# Patient Record
Sex: Male | Born: 1995 | Race: White | Hispanic: No | Marital: Single | Smoking: Never smoker
Health system: Southern US, Community
[De-identification: ages and names within clinical notes are randomized; demographics above are authoritative.]

---
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
## 2017-01-15 ENCOUNTER — Ambulatory Visit (INDEPENDENT_AMBULATORY_CARE_PROVIDER_SITE_OTHER): Payer: PRIVATE HEALTH INSURANCE | Admitting: Otolaryngology

## 2017-01-15 DIAGNOSIS — R040 Epistaxis: Secondary | ICD-10-CM | POA: Diagnosis not present

## 2018-09-05 ENCOUNTER — Ambulatory Visit (INDEPENDENT_AMBULATORY_CARE_PROVIDER_SITE_OTHER): Payer: BC Managed Care – PPO | Admitting: Orthopaedic Surgery

## 2018-09-05 ENCOUNTER — Encounter: Payer: Self-pay | Admitting: Orthopaedic Surgery

## 2018-09-05 ENCOUNTER — Other Ambulatory Visit: Payer: Self-pay

## 2018-09-05 DIAGNOSIS — M67869 Other specified disorders of synovium and tendon, unspecified knee: Secondary | ICD-10-CM | POA: Diagnosis not present

## 2018-09-05 DIAGNOSIS — M7650 Patellar tendinitis, unspecified knee: Secondary | ICD-10-CM | POA: Insufficient documentation

## 2018-09-05 NOTE — Progress Notes (Signed)
Office Visit Note   Patient: Victor SimplerMatthew Saye           Date of Birth: 02/17/1996           MRN: [id number] Visit Date: 09/05/2018              Requested by: Richardean Chimeraaniel, Terry G, MD [address] [place],  [place] [id number] PCP: Richardean Chimeraaniel, Terry G, MD   Assessment & Plan: Visit Diagnoses:  1. Patellar tendinosis           Right knee   Plan: We will set him up for some physical therapy possibly iontophoresis for treatment of his patellar tendinopathy.  We discussed trying to modify his MMA training to decrease patellar tendon stresses.  He can discuss with his therapist.  Recheck 5 weeks if he is having persistent problems will consider MRI imaging.  Pathophysiology discussed we looked knee pictures and patellar tendon anatomy pictures.  Follow-Up Instructions: Return in about 5 weeks (around 10/10/2018).   Orders:  No orders of the defined types were placed in this encounter.  No orders of the defined types were placed in this encounter.     Procedures: No procedures performed   Clinical Data: No additional findings.   Subjective: Chief Complaint  Patient presents with  . Right Knee - Pain    HPI 23 year old male with right knee pain onset January 2020 when he went to a conference in [place].  He likes to participate in MMA which is done for several years and notes pain with jumping.  The soreness is anteriorly over the knee.  Used a knee brace he has some pain with stairs.  Is treated with prednisone 60 mg/day for 3 days and states it made it feel tremendously better but his symptoms have recurred when it stopped.  No knee effusion noted no past history of injury.  He did play some football in high school.  No rheumatologic conditions.  Previous Dupuytren's excision in his left palm.  Recent radiographs of his knee were normal.  No locking chills or fever.  Review of Systems 14 point review of system positive for acid reflux depression and migraines.  He was no current medications.   Previous prednisone as listed above.   Objective: Vital Signs: Ht 5\' 11"  (1.803 m)   Wt 200 lb (90.7 kg)   BMI 27.89 kg/m   Physical Exam Constitutional:      Appearance: He is well-developed.  HENT:     Head: Normocephalic and atraumatic.  Eyes:     Pupils: Pupils are equal, round, and reactive to light.  Neck:     Thyroid: No thyromegaly.     Trachea: No tracheal deviation.  Cardiovascular:     Rate and Rhythm: Normal rate.  Pulmonary:     Effort: Pulmonary effort is normal.     Breath sounds: No wheezing.  Abdominal:     General: Bowel sounds are normal.     Palpations: Abdomen is soft.  Skin:    General: Skin is warm and dry.     Capillary Refill: Capillary refill takes less than 2 seconds.  Neurological:     Mental Status: He is alert and oriented to person, place, and time.  Psychiatric:        Behavior: Behavior normal.        Thought Content: Thought content normal.        Judgment: Judgment normal.     Ortho Exam pendent patient has tenderness over the  patellar tendon more proximal than mid and distal primarily in the midportion.  Negative patella grind test collateral crucial ligament exam is normal no knee effusion hip range of motion is normal negative straight leg raising negative popliteal compression test no rash over exposed skin distal pulses are 2+.  Ankle range of motion is normal.  He is able ambulate.  He is slow with single-leg hopping and has pain over the patellar tendon on his right knee no problems with the left.  Specialty Comments:  No specialty comments available.  Imaging: No results found.   PMFS History: Patient Active Problem List   Diagnosis Date Noted  . 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 Localized superficial swelling, mass, or lump   . ANKLE PAIN 07/24/2007   No past medical history on file.  No family history on file.  Past Surgical History:  Procedure Laterality Date  . CYST EXCISION Left 07/29/2015   Procedure: EXCISION  OF MASS, LEFT PALM;  Surgeon: Carole Civil, MD;  Location: [facility];  Service: Orthopedics;  Laterality: Left;   Social History   Occupational History  . Not on file  Tobacco Use  . Smoking status: Never Smoker  . Smokeless tobacco: Never Used  Substance and Sexual Activity  . Alcohol use: No  . Drug use: Yes    Types: Marijuana  . Sexual activity: Not on file

## 2018-09-19 ENCOUNTER — Ambulatory Visit (HOSPITAL_COMMUNITY): Payer: BC Managed Care – PPO | Attending: Orthopaedic Surgery

## 2018-09-19 ENCOUNTER — Encounter (HOSPITAL_COMMUNITY): Payer: Self-pay

## 2018-09-19 ENCOUNTER — Other Ambulatory Visit: Payer: Self-pay

## 2018-09-19 DIAGNOSIS — M25561 Pain in right knee: Secondary | ICD-10-CM | POA: Diagnosis not present

## 2018-09-19 DIAGNOSIS — G8929 Other chronic pain: Secondary | ICD-10-CM | POA: Diagnosis present

## 2018-09-19 DIAGNOSIS — R29898 Other symptoms and signs involving the musculoskeletal system: Secondary | ICD-10-CM | POA: Insufficient documentation

## 2018-09-19 NOTE — Therapy (Addendum)
[facility] [address] [place], [place], [address] Phone: [phone]   Fax:  [phone]  Physical Therapy Evaluation  Patient Details  Name: Victor SimplerMatthew Ashley MRN: [id number] Date of Birth: 11/20/1995 Referring Provider (PT): Eldred MangesMark C Yates, MD   Encounter Date: 09/19/2018  PT End of Session - 09/19/18 1413    Visit Number  1    Number of Visits  4    Date for PT Re-Evaluation  10/17/18    Authorization Type  BCBS    Authorization Time Period  09/19/18 to 10/17/18    PT Start Time  1318    PT Stop Time  1358    PT Time Calculation (min)  40 min    Activity Tolerance  Patient tolerated treatment well    Behavior During Therapy  [facility] for tasks assessed/performed       History reviewed. No pertinent past medical history.  Past Surgical History:  Procedure Laterality Date  . CYST EXCISION Left 07/29/2015   Procedure: EXCISION OF MASS, LEFT PALM;  Surgeon: Vickki HearingStanley E Harrison, MD;  Location: [facility];  Service: Orthopedics;  Laterality: Left;    There were no vitals filed for this visit.   Subjective Assessment - 09/19/18 1321    Subjective  Pt reports January 2020 he began experiencing pain when bending R knee, straightening and standing on it with no injury known. Pt reports inability to fully extend L knee and with pain. Pt reports pain right around knee cap, but not tender to palpation. Pt reports doctor states he thinks pt has LCL tears, but should attempt PT, and if no progress or pain relief then order MRI for further testing. Pt reports sharp pain as soon as attempting to straighten, always nagging discomfort, difficulty with pivoting, stairs, kick boxing and sparing. Pt reports wearing neoprene brace when he remembers which relieves the pain. Pt reports sparring and kick-boxing weekly in barefeet and running long distances with pain, but pushes through.    Limitations  Walking    How long can you sit comfortably?  no issues    How long can  you stand comfortably?  no issues    How long can you walk comfortably?  hurts after a few steps, but then eases; hurts with long distance running (3-4 miles)    Diagnostic tests  x-ray    Patient Stated Goals  get rid of the pain    Currently in Pain?  No/denies    Aggravating Factors   fully extension, bending, weight on it, pivoting in standing    Pain Relieving Factors  rest, NSAIDs    Effect of Pain on Daily Activities  minimal effect, pushed through         [facility] - 09/19/18 0001      Assessment   Medical Diagnosis  Right Patellar Tendinopathy    Referring Provider (PT)  Eldred MangesMark C Yates, MD    Onset Date/Surgical Date  03/20/18   begining of 2020   Next MD Visit  Later this month    Prior Therapy  not for this      Precautions   Precautions  None      Restrictions   Weight Bearing Restrictions  No      Balance Screen   Has the patient fallen in the past 6 months  No    Has the patient had a decrease in activity level because of a fear of falling?   No  Is the patient reluctant to leave their home because of a fear of falling?   No      Prior Function   Level of Independence  Independent    Vocation  Part time employment    Vocation Requirements  driving, digging holes, working on Cytogeneticist training, working out      Observation/Other Assessments   Focus on Therapeutic Outcomes (FOTO)   to be completed next visit      Sensation   Light Touch  Appears Intact      Functional Tests   Functional tests  Single Leg Squat;Other;Step down;Other2   R knee pain with all functional tests     Step Down   Comments  R: knee valgus, tibial external rotation, foot eversion, and mild unsteadiness; L: early heel rise, knee valgus      Single Leg Squat   Comments  R: knee valgus (R knee touches LLE), tibial external rotation, foot eversion, and mild unsteadiness; L: early heel rise, knee valgus (to midline, doesn't touch RLE)      Other:   Other/  Comments  Single leg hop: R: 62.5" L: 71"; single leg 3 hop: R: 15'10" L: 14'; cross over: R: 14' 2.5", L: 14' 9.75"   R knee pain with landing>jumping     Other:   Other/Comments  1RM R: 20#, L: 80#   R knee pain, unable to fully extend     ROM / Strength   AROM / PROM / Strength  AROM;Strength      AROM   AROM Assessment Site  Knee;Ankle    Right/Left Knee  Right;Left    Right Knee Extension  2    Right Knee Flexion  130    Left Knee Extension  -4   hyperextension   Left Knee Flexion  130    Right Ankle Dorsiflexion  0    Left Ankle Dorsiflexion  0      Strength   Strength Assessment Site  Hip;Knee;Ankle    Right Hip Flexion  5/5    Right Hip Extension  5/5    Right Hip ABduction  5/5    Left Hip Flexion  5/5    Left Hip Extension  5/5    Left Hip ABduction  5/5    Right Knee Flexion  5/5    Right Knee Extension  4+/5   pain   Left Knee Flexion  5/5    Left Knee Extension  5/5    Right Ankle Dorsiflexion  5/5    Right Ankle Plantar Flexion  5/5    Left Ankle Dorsiflexion  5/5    Left Ankle Plantar Flexion  5/5      Flexibility   Soft Tissue Assessment /Muscle Length  yes    Hamstrings  90/90: WFL bil    Quadriceps  WFL bil   no pain     Palpation   Patella mobility  good mobility bil    Palpation comment  denies tenderness over patellar tendon, MCL, LCL, patella      Ambulation/Gait   Ambulation/Gait  Yes    Gait Pattern  Step-through pattern;Decreased dorsiflexion - right;Decreased dorsiflexion - left   increased R toe-out, early heel rise bil   Gait Comments  Barefoot, level ground, no pain         Objective measurements completed on examination: See above findings.      PT Education - 09/19/18 1413    Education Details  Assessment findings, POC, initaited HEP and exercise technique    Person(s) Educated  Patient    Methods  Explanation;Demonstration;Handout    Comprehension  Verbalized understanding       PT Short Term Goals - 09/19/18 1419       PT SHORT TERM GOAL #1   Title  Pt will increase R quad 1RM to 40# to demo improved strength and reduced pain.    Baseline  7/2: R:20#, L:80#    Time  3    Period  Weeks    Status  New    Target Date  10/10/18      PT SHORT TERM GOAL #2   Title  Pt will report 4/10 pain during exercise regimen to demo improved mechanics and improve QoL.    Time  3    Period  Weeks    Status  New      PT SHORT TERM GOAL #3   Title  Pt will be independent with HEP to maximize gait pattern, improve strength and return to PLOF.    Time  3    Period  Weeks    Status  New        PT Long Term Goals - 09/19/18 1450      PT LONG TERM GOAL #1   Title  Pt will demo equal single leg hop tests and report no pain in R knee with performance.    Time  4    Period  Weeks    Status  New    Target Date  10/17/18      PT LONG TERM GOAL #2   Title  Pt will increase R quad 1RM to 60# to demo improved strength and reduced pain.    Time  4    Period  Weeks    Status  New      PT LONG TERM GOAL #3   Title  Pt will have 10 degrees of active dorsiflexion bil to allow for proper body mechanics with stair negotiation and gait.    Time  4    Period  Weeks    Status  New      PT LONG TERM GOAL #4   Title  Pt will perform single leg squat bil with proper body mechanics, avoiding knee valgus, eversion or early heel rise compensations and deny pain to demo improved functional strength and mechanics.    Time  4    Period  Weeks    Status  New         Plan - 09/19/18 1416    Clinical Impression Statement  Pt is a pleasant 22YO male with complaints of R patellar tendon pain that has been going on for 7 months with no known mechanism of injury. 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 Pt specifically has increased pain with pivoting and weighted bent-knee activities like stairs and is  unable to fully extend R knee. Pt with good strength throughout BLE, but is limited AROM for R knee extension and bil dorsiflexion. Per single limb hop test, R and L LE distances are within 90% of one another, but pain in present with all R LE hopping. Pt with compensations on single leg squat and step down bil due to lack of active dorsiflexion. As noted above, pt with R knee valgus, tibial external rotation and foot eversion all noted in both  single limb squat and step down tests. Pt with early heel rise when performing L single limb squat and step down test. Pt demonstrates weak R quad with 1RM test performing at 80# on L and 20# on R with pain and inability to fully extend R knee throughout LAQ range. Pt is showing signs and symptoms consistent with possible meniscal pathology and demonstrates impaired ankle mobility with functional activities. Pt would benefit from skilled PT to improve positional deficits, strength throughout BLE without compensations, AROM throughout BLE and relieve pain to improve ability to perform functional tasks.    Personal Factors and Comorbidities  Age;Comorbidity 1;Fitness;Time since onset of injury/illness/exacerbation    Comorbidities  see above    Examination-Activity Limitations  Squat;Stairs    Examination-Participation Restrictions  Community Activity    Stability/Clinical Decision Making  Stable/Uncomplicated    Clinical Decision Making  Low    Rehab Potential  Good    PT Frequency  1x / week    PT Duration  4 weeks    PT Treatment/Interventions  ADLs/Self Care Home Management;Aquatic Therapy;Biofeedback;Cryotherapy;Electrical Stimulation;Iontophoresis 4mg /ml Dexamethasone;Moist Heat;Ultrasound;Gait training;Stair training;Functional mobility training;Therapeutic activities;Therapeutic exercise;Balance training;Neuromuscular re-education;Patient/family education;Orthotic Fit/Training;Manual techniques;Passive range of motion;Dry needling;Taping;Joint Manipulations     PT Next Visit Plan  Initiate ankle ROM exercises, calf/gastroc stretch, intrinsic foot strengthening, standing balance and strengthening exercises with good hip/knee/ankle alignment, STM and MWM as needed to relieve pain and improve ankle ROM    PT Home Exercise Plan  Eval: knee drives in half kneel, calf stretch    Consulted and Agree with Plan of Care  Patient       Patient will benefit from skilled therapeutic intervention in order to improve the following deficits and impairments:  Decreased activity tolerance, Decreased range of motion, Decreased strength, Improper body mechanics, Pain  Visit Diagnosis: 1. Chronic pain of right knee   2. Other symptoms and signs involving the musculoskeletal system        Problem List Patient Active Problem List   Diagnosis Date Noted  . 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 Localized superficial swelling, mass, or lump   . ANKLE PAIN 07/24/2007     Domenick Bookbinderori Zed Wanninger PT, DPT  [facility] [address] [place], [place], [address] Phone: [phone]   Fax:  [phone]  Name: Victor SimplerMatthew Hannibal MRN: [id number] Date of Birth: 03/12/1996

## 2018-09-27 ENCOUNTER — Ambulatory Visit (HOSPITAL_COMMUNITY): Payer: BC Managed Care – PPO

## 2018-09-27 ENCOUNTER — Other Ambulatory Visit: Payer: Self-pay

## 2018-09-27 ENCOUNTER — Encounter (HOSPITAL_COMMUNITY): Payer: Self-pay

## 2018-09-27 ENCOUNTER — Encounter

## 2018-09-27 DIAGNOSIS — G8929 Other chronic pain: Secondary | ICD-10-CM

## 2018-09-27 DIAGNOSIS — M25561 Pain in right knee: Secondary | ICD-10-CM | POA: Diagnosis not present

## 2018-09-27 DIAGNOSIS — R29898 Other symptoms and signs involving the musculoskeletal system: Secondary | ICD-10-CM

## 2018-09-27 NOTE — Patient Instructions (Signed)
Gastroc, Standing ° ° ° °Stand, right foot behind, heel on floor and turned slightly out, leg straight, forward leg bent. Keeping arms straight, push pelvis forward until stretch is felt in calf. Hold 30 seconds. °Repeat 3 times per session. Do 2 sessions per day. ° °Copyright © [employer]. All rights reserved.  ° °

## 2018-09-27 NOTE — Therapy (Signed)
[facility] [address] [place], [place], [address] Phone: [phone]   Fax:  [phone]  Physical Therapy Treatment  Patient Details  Name: Victor SimplerMatthew Ashley MRN: [id number] Date of Birth: 08/16/1995 Referring Provider (PT): Eldred MangesMark C Yates, MD   Encounter Date: 09/27/2018  PT End of Session - 09/27/18 1412    Visit Number  2    Number of Visits  4    Date for PT Re-Evaluation  10/17/18    Authorization Type  BCBS    Authorization Time Period  09/19/18 to 10/17/18    PT Start Time  1315    PT Stop Time  1402    PT Time Calculation (min)  47 min    Activity Tolerance  Patient tolerated treatment well    Behavior During Therapy  [facility] for tasks assessed/performed       History reviewed. No pertinent past medical history.  Past Surgical History:  Procedure Laterality Date  . CYST EXCISION Left 07/29/2015   Procedure: EXCISION OF MASS, LEFT PALM;  Surgeon: Vickki HearingStanley E Harrison, MD;  Location: [facility];  Service: Orthopedics;  Laterality: Left;    There were no vitals filed for this visit.  Subjective Assessment - 09/27/18 1315    Subjective  Pt reports he went hiking last weekend and had increased pain following.  Current pain scale 2/10 Rt knee, sharp pain with certain movements.    Currently in Pain?  Yes    Pain Score  2     Pain Location  Knee    Pain Orientation  Right    Pain Descriptors / Indicators  Sharp    Pain Type  Acute pain    Pain Onset  More than a month ago    Pain Frequency  Intermittent    Aggravating Factors   fully extension, bending, weight on it, pivoting in standing    Pain Relieving Factors  rest, NSAIDs    Effect of Pain on Daily Activities  minimal effect, pushed through         [facility] - 09/27/18 0001      Assessment   Medical Diagnosis  Right Patellar Tendinopathy    Referring Provider (PT)  Eldred MangesMark C Yates, MD    Onset Date/Surgical Date  03/20/18   beginning of 2020   Next MD Visit  Later  this month    Prior Therapy  not for this      Observation/Other Assessments   Focus on Therapeutic Outcomes (FOTO)   44% limited                   [facility] Adult PT Treatment/Exercise - 09/27/18 0001      Exercises   Exercises  Knee/Hip      Knee/Hip Exercises: Stretches   Knee: Self-Stretch to increase Flexion  5 reps;10 seconds    Knee: Self-Stretch Limitations  Knee drive on 69GE12in step with therapist MWM for DF    Gastroc Stretch  30 seconds;4 reps    Gastroc Stretch Limitations  2 against wall, 2 set slant board    Other Knee/Hip Stretches  Half kneeling knee drive for DF 3x 30"      Knee/Hip Exercises: Standing   Other Standing Knee Exercises  Arch formation with great toe extension 10x 10" holds (ceuing for mechanics)      Knee/Hip Exercises: Seated   Other Seated Knee/Hip Exercises  towel crunch 5#  x 3    Other Seated Knee/Hip  Exercises  Inv/Ev 5# 3x each      Manual Therapy   Manual Therapy  Soft tissue mobilization    Manual therapy comments  Manual complete separate than rest of tx    Soft tissue mobilization  STM gastroc/soleus complex in prone position             PT Education - 09/27/18 1328    Education Details  Reviewed goals, assured compliance with HEP with min cueing to improve form, FOTO complete    Person(s) Educated  Patient    Methods  Explanation    Comprehension  Verbalized understanding       PT Short Term Goals - 09/19/18 1419      PT SHORT TERM GOAL #1   Title  Pt will increase R quad 1RM to 40# to demo improved strength and reduced pain.    Baseline  7/2: R:20#, L:80#    Time  3    Period  Weeks    Status  New    Target Date  10/10/18      PT SHORT TERM GOAL #2   Title  Pt will report 4/10 pain during exercise regimen to demo improved mechanics and improve QoL.    Time  3    Period  Weeks    Status  New      PT SHORT TERM GOAL #3   Title  Pt will be independent with HEP to maximize gait pattern, improve strength and  return to PLOF.    Time  3    Period  Weeks    Status  New        PT Long Term Goals - 09/19/18 1450      PT LONG TERM GOAL #1   Title  Pt will demo equal single leg hop tests and report no pain in R knee with performance.    Time  4    Period  Weeks    Status  New    Target Date  10/17/18      PT LONG TERM GOAL #2   Title  Pt will increase R quad 1RM to 60# to demo improved strength and reduced pain.    Time  4    Period  Weeks    Status  New      PT LONG TERM GOAL #3   Title  Pt will have 10 degrees of active dorsiflexion bil to allow for proper body mechanics with stair negotiation and gait.    Time  4    Period  Weeks    Status  New      PT LONG TERM GOAL #4   Title  Pt will perform single leg squat bil with proper body mechanics, avoiding knee valgus, eversion or early heel rise compensations and deny pain to demo improved functional strength and mechanics.    Time  4    Period  Weeks    Status  New            Plan - 09/27/18 1415    Clinical Impression Statement  Reviewed goals, FOTO complete and assured compliance with HEP.  Pt able to recall 1 HEP exercises- calf stretch but not knee drives for DF.  Reviewed exercises with cueing to assure correct form and technique for maximal benefits.  Session focus on ankle mobility, stretches, intrinsic strengthening and manual to improve dorsiflexion and reduce overall tightness in gastroc/soleus complex.  Pt able to complete all exercises with no reports of  increased pain.  Most difficulty with arch formation exercises, tendency to compensate and required verbal and tactile cueing for proper form.  Manual MWM complete with knee drive on step to improve dorsiflexion and ended session wiht soft tissue mobilization with reports of feeling looser at EOS.    Personal Factors and Comorbidities  Age;Comorbidity 1;Fitness;Time since onset of injury/illness/exacerbation    Comorbidities  see above    Examination-Activity  Limitations  Squat;Stairs    Examination-Participation Restrictions  Community Activity    Stability/Clinical Decision Making  Stable/Uncomplicated    Clinical Decision Making  Low    Rehab Potential  Good    PT Frequency  1x / week    PT Duration  4 weeks    PT Treatment/Interventions  ADLs/Self Care Home Management;Aquatic Therapy;Biofeedback;Cryotherapy;Electrical Stimulation;Iontophoresis 4mg /ml Dexamethasone;Moist Heat;Ultrasound;Gait training;Stair training;Functional mobility training;Therapeutic activities;Therapeutic exercise;Balance training;Neuromuscular re-education;Patient/family education;Orthotic Fit/Training;Manual techniques;Passive range of motion;Dry needling;Taping;Joint Manipulations    PT Next Visit Plan  Add arch formation to HEP if able to complete correclty without cueing.  Continue with ankle ROM exercises, calf/gastroc stretch, intrinsic foot strengthening, standing balance and strengthening exercises with good hip/knee/ankle alignment, STM and MWM as needed to relieve pain and improve ankle ROM    PT Home Exercise Plan  Eval: knee drives in half kneel, calf stretch       Patient will benefit from skilled therapeutic intervention in order to improve the following deficits and impairments:  Decreased activity tolerance, Decreased range of motion, Decreased strength, Improper body mechanics, Pain  Visit Diagnosis: 1. Chronic pain of right knee   2. Other symptoms and signs involving the musculoskeletal system        Problem List Patient Active Problem List   Diagnosis Date Noted  . 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 Localized superficial swelling, mass, or lump   . ANKLE PAIN 07/24/2007   Becky Saxasey Jahmari Esbenshade, LPTA; CBIS [phone]  Juel BurrowCockerham, Alenah Sarria Jo 09/27/2018, 3:58 PM   [facility] [address] [place], [place], [address] Phone: [phone]   Fax:  [phone]  Name: Victor SimplerMatthew Limb MRN: [id number] Date of  Birth: 11/17/1995

## 2018-10-07 ENCOUNTER — Telehealth (HOSPITAL_COMMUNITY): Payer: Self-pay | Admitting: Physical Therapy

## 2018-10-07 NOTE — Telephone Encounter (Signed)
He can not get here today  -requested a Friday

## 2018-10-08 ENCOUNTER — Ambulatory Visit (HOSPITAL_COMMUNITY): Payer: BC Managed Care – PPO | Admitting: Physical Therapy

## 2018-10-10 ENCOUNTER — Other Ambulatory Visit: Payer: Self-pay

## 2018-10-10 ENCOUNTER — Encounter: Payer: Self-pay | Admitting: Orthopaedic Surgery

## 2018-10-10 ENCOUNTER — Ambulatory Visit (INDEPENDENT_AMBULATORY_CARE_PROVIDER_SITE_OTHER): Payer: BC Managed Care – PPO | Admitting: Orthopaedic Surgery

## 2018-10-10 VITALS — BP 129/83 | HR 84 | Ht 71.0 in | Wt 211.0 lb

## 2018-10-10 DIAGNOSIS — M67869 Other specified disorders of synovium and tendon, unspecified knee: Secondary | ICD-10-CM

## 2018-10-10 DIAGNOSIS — M7650 Patellar tendinitis, unspecified knee: Secondary | ICD-10-CM

## 2018-10-11 ENCOUNTER — Encounter (HOSPITAL_COMMUNITY): Payer: Self-pay | Admitting: Physical Therapy

## 2018-10-11 ENCOUNTER — Ambulatory Visit (HOSPITAL_COMMUNITY): Payer: BC Managed Care – PPO | Admitting: Physical Therapy

## 2018-10-11 DIAGNOSIS — G8929 Other chronic pain: Secondary | ICD-10-CM

## 2018-10-11 DIAGNOSIS — M25561 Pain in right knee: Secondary | ICD-10-CM | POA: Diagnosis not present

## 2018-10-11 DIAGNOSIS — R29898 Other symptoms and signs involving the musculoskeletal system: Secondary | ICD-10-CM

## 2018-10-11 NOTE — Progress Notes (Signed)
Office Visit Note   Patient: Victor Ashley           Date of Birth: 08/08/1995           MRN: [id number] Visit Date: 10/10/2018              Requested by: Richardean Chimeraaniel, Terry G, MD [address] [place],  [place] [address] PCP: Richardean Chimeraaniel, Terry G, MD   Assessment & Plan: Visit Diagnoses:  1. Patellar tendinosis     Plan: We will proceed with MRI scan of his right knee rule out patellar tendinitis versus tear the anterior horn the medial meniscus.  Office follow-up after scan for review.  Follow-Up Instructions: No follow-ups on file.   Orders:  Orders Placed This Encounter  Procedures  . MR Knee Right w/o contrast   No orders of the defined types were placed in this encounter.     Procedures: No procedures performed   Clinical Data: No additional findings.   Subjective: Chief Complaint  Patient presents with  . Right Knee - Pain, Follow-up    HPI 23 year old male returns with ongoing problems with right knee pain anteriorly.  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  He has been through physical therapy x1 month and states some of the activities such as jumping and resistive exercises with therapy made him have significant increase in pain.  He continues to have pain with walking denies problems with knee effusion no other joint symptoms and has pain anteriorly over the patellar tendon is slightly over the medial joint line.  Patient is used anti-inflammatories and intermittent ice.  Symptoms have persisted.  He is not able to resume mixed martial arts activities and is requesting proceeding with an MRI scan.  Last month plain radiographs were obtained and were normal.  Review of Systems 14 point review of systems is updated unchanged from 09/05/2018 office visit.   Objective: Vital Signs: BP 129/83   Pulse 84   Ht 5\' 11"  (1.803 m)   Wt 211 lb (95.7 kg)   BMI 29.43 kg/m   Physical Exam Constitutional:      Appearance: He is well-developed.  HENT:   Head: Normocephalic and atraumatic.  Eyes:     Pupils: Pupils are equal, round, and reactive to light.  Neck:     Thyroid: No thyromegaly.     Trachea: No tracheal deviation.  Cardiovascular:     Rate and Rhythm: Normal rate.  Pulmonary:     Effort: Pulmonary effort is normal.     Breath sounds: No wheezing.  Abdominal:     General: Bowel sounds are normal.     Palpations: Abdomen is soft.  Skin:    General: Skin is warm and dry.     Capillary Refill: Capillary refill takes less than 2 seconds.  Neurological:     Mental Status: He is alert and oriented to person, place, and time.  Psychiatric:        Behavior: Behavior normal.        Thought Content: Thought content normal.        Judgment: Judgment normal.     Ortho Exam patient has tenderness along the midportion the more proximal portion of the patellar tendon adjacent to the patella.  No knee effusion is noted normal patellar tracking he has some tenderness over the anterior horn of the medial meniscus but more tenderness over the patellar tendon.  Distal aspect of the tendon is normal.  Collateral crucial  ligament exam is normal.  He has some pain with hyperextension at the anterior horn of the medial meniscus.  Specialty Comments:  No specialty comments available.  Imaging: No results found.   PMFS History: Patient Active Problem List   Diagnosis Date Noted  . 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 Localized superficial swelling, mass, or lump   . ANKLE PAIN 07/24/2007   History reviewed. No pertinent past medical history.  History reviewed. No pertinent family history.  Past Surgical History:  Procedure Laterality Date  . CYST EXCISION Left 07/29/2015   Procedure: EXCISION OF MASS, LEFT PALM;  Surgeon: Carole Civil, MD;  Location: [facility];  Service: Orthopedics;  Laterality: Left;   Social History   Occupational History  . Not on file  Tobacco Use  . Smoking status: Never Smoker  . Smokeless tobacco: Never  Used  Substance and Sexual Activity  . Alcohol use: No  . Drug use: Yes    Types: Marijuana  . Sexual activity: Not on file

## 2018-10-11 NOTE — Therapy (Signed)
[facility] [address] [place], [place], [address] Phone: [phone]   Fax:  [phone]  Physical Therapy Treatment  Patient Details  Name: Victor SimplerMatthew Ashley MRN: [id number] Date of Birth: 09/11/1995 Referring Provider (PT): Eldred MangesMark C Yates, MD   Encounter Date: 10/11/2018  PT End of Session - 10/11/18 0937    Visit Number  3    Number of Visits  4    Date for PT Re-Evaluation  10/17/18    Authorization Type  BCBS    Authorization Time Period  09/19/18 to 10/17/18    PT Start Time  0931    PT Stop Time  1010    PT Time Calculation (min)  39 min    Activity Tolerance  Patient tolerated treatment well    Behavior During Therapy  [facility] for tasks assessed/performed       History reviewed. No pertinent past medical history.  Past Surgical History:  Procedure Laterality Date  . CYST EXCISION Left 07/29/2015   Procedure: EXCISION OF MASS, LEFT PALM;  Surgeon: Vickki HearingStanley E Harrison, MD;  Location: [facility];  Service: Orthopedics;  Laterality: Left;    There were no vitals filed for this visit.  Subjective Assessment - 10/11/18 0934    Subjective  Patient reported right knee pain of 3/10 today. Patient reported that he went back to the knee MD yesterday and that they are scheduling an MRI.    Currently in Pain?  Yes    Pain Score  3     Pain Location  Knee    Pain Orientation  Right    Pain Descriptors / Indicators  Sharp    Pain Type  Acute pain    Pain Onset  More than a month ago                       [facility]/Exercise - 10/11/18 0001      Exercises   Exercises  Knee/Hip      Knee/Hip Exercises: Stretches   Knee: Self-Stretch to increase Flexion  5 reps;10 seconds    Knee: Self-Stretch Limitations  Knee drive on 69GE12in step with therapist MWM for DF    Gastroc Stretch  30 seconds;4 reps    Gastroc Stretch Limitations  2 against wall for gastroc and 2 against wall for soleus, 2 set slant board    Other  Knee/Hip Stretches  Half kneeling knee drive for DF 3x 30"      Knee/Hip Exercises: Standing   Other Standing Knee Exercises  Arch formation with great toe extension 10x 10" holds (ceuing for mechanics)    Other Standing Knee Exercises  Standing on Dyna disc 2x 30'' each LE to work on intrinsic muscles and ankle muscles      Knee/Hip Exercises: Seated   Other Seated Knee/Hip Exercises  towel crunch 5#  x 3    Other Seated Knee/Hip Exercises  Inv/Ev 5# 3x each      Manual Therapy   Manual Therapy  Soft tissue mobilization    Manual therapy comments  Manual complete separate than rest of tx    Soft tissue mobilization  STM gastroc/soleus complex in prone position             PT Education - 10/11/18 0936    Education Details  Discussed purpose and technique of interventions throughout.    Person(s) Educated  Patient    Methods  Explanation    Comprehension  Verbalized understanding       PT Short Term Goals - 09/19/18 1419      PT SHORT TERM GOAL #1   Title  Pt will increase R quad 1RM to 40# to demo improved strength and reduced pain.    Baseline  7/2: R:20#, L:80#    Time  3    Period  Weeks    Status  New    Target Date  10/10/18      PT SHORT TERM GOAL #2   Title  Pt will report 4/10 pain during exercise regimen to demo improved mechanics and improve QoL.    Time  3    Period  Weeks    Status  New      PT SHORT TERM GOAL #3   Title  Pt will be independent with HEP to maximize gait pattern, improve strength and return to PLOF.    Time  3    Period  Weeks    Status  New        PT Long Term Goals - 09/19/18 1450      PT LONG TERM GOAL #1   Title  Pt will demo equal single leg hop tests and report no pain in R knee with performance.    Time  4    Period  Weeks    Status  New    Target Date  10/17/18      PT LONG TERM GOAL #2   Title  Pt will increase R quad 1RM to 60# to demo improved strength and reduced pain.    Time  4    Period  Weeks    Status   New      PT LONG TERM GOAL #3   Title  Pt will have 10 degrees of active dorsiflexion bil to allow for proper body mechanics with stair negotiation and gait.    Time  4    Period  Weeks    Status  New      PT LONG TERM GOAL #4   Title  Pt will perform single leg squat bil with proper body mechanics, avoiding knee valgus, eversion or early heel rise compensations and deny pain to demo improved functional strength and mechanics.    Time  4    Period  Weeks    Status  New            Plan - 10/11/18 1023    Clinical Impression Statement  Continued with established plan of care this session. Added soleus stretch in standing this session as well as single limb stance on Dyna disc to challenge foot and ankle muscles. Ended session with soft tissue mobilization to patient's right gastroc and soleus with patient reporting a decrease in pain at the end of the session.    Personal Factors and Comorbidities  Age;Comorbidity 1;Fitness;Time since onset of injury/illness/exacerbation    Comorbidities  see above    Examination-Activity Limitations  Squat;Stairs    Examination-Participation Restrictions  Community Activity    Stability/Clinical Decision Making  Stable/Uncomplicated    Rehab Potential  Good    PT Frequency  1x / week    PT Duration  4 weeks    PT Treatment/Interventions  ADLs/Self Care Home Management;Aquatic Therapy;Biofeedback;Cryotherapy;Electrical Stimulation;Iontophoresis 4mg /ml Dexamethasone;Moist Heat;Ultrasound;Gait training;Stair training;Functional mobility training;Therapeutic activities;Therapeutic exercise;Balance training;Neuromuscular re-education;Patient/family education;Orthotic Fit/Training;Manual techniques;Passive range of motion;Dry needling;Taping;Joint Manipulations    PT Next Visit Plan  Add arch formation to HEP if able to complete correclty without cueing.  Continue with ankle ROM exercises, calf/gastroc stretch, intrinsic foot strengthening, standing balance  and strengthening exercises with good hip/knee/ankle alignment, STM and MWM as needed to relieve pain and improve ankle ROM    PT Home Exercise Plan  Eval: knee drives in half kneel, calf stretch       Patient will benefit from skilled therapeutic intervention in order to improve the following deficits and impairments:  Decreased activity tolerance, Decreased range of motion, Decreased strength, Improper body mechanics, Pain  Visit Diagnosis: 1. Chronic pain of right knee   2. Other symptoms and signs involving the musculoskeletal system        Problem List Patient Active Problem List   Diagnosis Date Noted  . 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 Localized superficial swelling, mass, or lump   . ANKLE PAIN 07/24/2007   Verne CarrowMacy Enriqueta Augusta PT, DPT 10:24 AM, 10/11/18 [phone]  [facility] [address] [place], [place], [address] Phone: [phone]   Fax:  [phone]  Name: Victor SimplerMatthew Spilker MRN: [id number] Date of Birth: 05/30/1995

## 2018-10-15 ENCOUNTER — Telehealth (HOSPITAL_COMMUNITY): Payer: Self-pay | Admitting: Family Medicine

## 2018-10-15 ENCOUNTER — Ambulatory Visit (HOSPITAL_COMMUNITY): Payer: BC Managed Care – PPO | Admitting: Physical Therapy

## 2018-10-15 NOTE — Telephone Encounter (Signed)
10/15/18  I called patient because he had chosen to cx today's appt via phone tree.  He did mean to cancel and we rescheduled appt for 8/7

## 2018-10-21 ENCOUNTER — Ambulatory Visit (HOSPITAL_COMMUNITY)
Admission: RE | Admit: 2018-10-21 | Discharge: 2018-10-21 | Disposition: A | Discharge status: Home / Self Care | Payer: BC Managed Care – PPO | Source: Ambulatory Visit | Attending: Orthopaedic Surgery | Admitting: Orthopaedic Surgery

## 2018-10-21 ENCOUNTER — Other Ambulatory Visit: Payer: Self-pay

## 2018-10-21 DIAGNOSIS — M67869 Other specified disorders of synovium and tendon, unspecified knee: Secondary | ICD-10-CM | POA: Insufficient documentation

## 2018-10-21 DIAGNOSIS — M7650 Patellar tendinitis, unspecified knee: Secondary | ICD-10-CM

## 2018-10-25 ENCOUNTER — Ambulatory Visit (HOSPITAL_COMMUNITY): Payer: BC Managed Care – PPO | Attending: Orthopaedic Surgery

## 2018-10-25 ENCOUNTER — Other Ambulatory Visit: Payer: Self-pay

## 2018-10-25 ENCOUNTER — Encounter (HOSPITAL_COMMUNITY): Payer: Self-pay

## 2018-10-25 DIAGNOSIS — G8929 Other chronic pain: Secondary | ICD-10-CM | POA: Diagnosis present

## 2018-10-25 DIAGNOSIS — R29898 Other symptoms and signs involving the musculoskeletal system: Secondary | ICD-10-CM

## 2018-10-25 DIAGNOSIS — M25561 Pain in right knee: Secondary | ICD-10-CM | POA: Insufficient documentation

## 2018-10-25 NOTE — Therapy (Signed)
[place] [address] [place], [place], [address] Phone: [phone]   Fax:  [phone]   PHYSICAL THERAPY DISCHARGE SUMMARY  Visits from Start of Care: 4  Current functional level related to goals / functional outcomes: See below   Remaining deficits: See below   Education / Equipment: See below  Plan: Patient agrees to discharge.  Patient goals were partially met. Patient is being discharged due to lack of progress.  ?????        Physical Therapy Treatment  Patient Details  Name: Victor Ashley MRN: [id number] Date of Birth: 09-30-1995 Referring Provider (PT): Marybelle Killings, MD   Encounter Date: 10/25/2018  PT End of Session - 10/25/18 0825    Visit Number  4    Number of Visits  4    Date for PT Re-Evaluation  10/17/18    Authorization Type  BCBS    Authorization Time Period  09/19/18 to 10/17/18; NEW: 10/25/18 (cover d/c)    PT Start Time  0822    PT Stop Time  0850    PT Time Calculation (min)  28 min    Activity Tolerance  Patient tolerated treatment well    Behavior During Therapy  [facility] for tasks assessed/performed       History reviewed. No pertinent past medical history.  Past Surgical History:  Procedure Laterality Date  . CYST EXCISION Left 07/29/2015   Procedure: EXCISION OF MASS, LEFT PALM;  Surgeon: Carole Civil, MD;  Location: [facility];  Service: Orthopedics;  Laterality: Left;    There were no vitals filed for this visit.  Subjective Assessment - 10/25/18 0824    Subjective  Pt reports he feels like he can maintain at home and wishes to be discharged today. Pt reports he got MRI Monday but has not received results yet.    Limitations  Walking    How long can you sit comfortably?  no issues    How long can you stand comfortably?  no issues    How long can you walk comfortably?  hurts after a few steps, but then eases; hurts with long distance running (3-4 miles)    Diagnostic tests  x-ray    Patient  Stated Goals  get rid of the pain    Currently in Pain?  No/denies    Pain Onset  More than a month ago         [facility] PT Assessment - 10/25/18 0001      Assessment   Medical Diagnosis  Right Patellar Tendinopathy    Referring Provider (PT)  Marybelle Killings, MD    Onset Date/Surgical Date  03/20/18   beginning of 2020   Next MD Visit  waiting MRI results to schedule    Prior Therapy  not for this      Precautions   Precautions  None      Restrictions   Weight Bearing Restrictions  No      Balance Screen   Has the patient fallen in the past 6 months  No    Has the patient had a decrease in activity level because of a fear of falling?   No    Is the patient reluctant to leave their home because of a fear of falling?   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, working out      Environmental manager;Other;Step down;Other2   R knee pain with all functional tests     Step Down   Comments  R: knee valgus, tibial external rotation, foot eversion, and mild unsteadiness; L: early heel rise, knee valgus   same as eval     Single Leg Squat   Comments  R: knee valgus (to midline, R knee doesn't touch LLE), foot eversion, and mild unsteadiness; L: maintains heel contact, knee valgus (to midline)   minimally improved from eval     Other:   Other/ Comments  single leg hop: R: 69.5', L: 61.75"; single leg 3 hop: R:14' 9.5", L: 14' 8.25"   unable to perform cross over due to R knee pain     Other:   Other/Comments  1RM: R: 10# full extension, pain; K: 80#   1RM R: 20#(pain, unable ot fully ext), L: 80#     AROM   AROM Assessment Site  Knee;Ankle    Right/Left Knee  Right;Left    Right Knee Extension  0   was 2   Right Knee Flexion  128   was 130   Left Knee Extension  -4   was -4 hyperextension    Left Knee Flexion  130   was 130   Right Ankle Dorsiflexion  5   was 0   Left Ankle Dorsiflexion  10   10     Strength   Right Knee Extension  5/5   was 4+     Ambulation/Gait   Ambulation/Gait  Yes    Gait Pattern  Step-through pattern;Decreased dorsiflexion - right;Decreased dorsiflexion - left   increased R toe-out, early heel rise bil   Gait Comments  shoes on, level ground            PT Education - 10/25/18 0825    Education Details  Discharged to HEP    Person(s) Educated  Patient    Methods  Explanation    Comprehension  Verbalized understanding       PT Short Term Goals - 10/25/18 0826      PT SHORT TERM GOAL #1   Title  Pt will increase R quad 1RM to 40# to demo improved strength and reduced pain.    Baseline  7/2: R:20#, L:80#; 8/7: 1R; R 10# with full extension and pain    Time  3    Period  Weeks    Status  Not Met    Target Date  10/10/18      PT SHORT TERM GOAL #2   Title  Pt will report 4/10 pain during exercise regimen to demo improved mechanics and improve QoL.    Baseline  8/7: pt reports 7/10    Time  3    Period  Weeks    Status  Not Met      PT SHORT TERM GOAL #3   Title  Pt will be independent with HEP to maximize gait pattern, improve strength and return to PLOF.    Baseline  8/7: reports compliance    Time  3    Period  Weeks    Status  Achieved        PT Long Term Goals - 10/25/18 0827      PT LONG TERM GOAL #1   Title  Pt will demo equal single leg hop tests and  report no pain in R knee with performance.    Baseline  8/7: R: 69.5", L: 61.75" and significant pain in R knee    Time  4    Period  Weeks    Status  Not Met      PT LONG TERM GOAL #2   Title  Pt will increase R quad 1RM to 60# to demo improved strength and reduced pain.    Baseline  8/7: R 10# with full ext and pain    Time  4    Period  Weeks    Status  Not Met      PT LONG TERM GOAL #3   Title  Pt will have 10 degrees of active dorsiflexion bil to allow  for proper body mechanics with stair negotiation and gait.    Baseline  8/7: L 10 deg DF, R 5 deg DF    Time  4    Period  Weeks    Status  Partially Met      PT LONG TERM GOAL #4   Title  Pt will perform single leg squat bil with proper body mechanics, avoiding knee valgus, eversion or early heel rise compensations and deny pain to demo improved functional strength and mechanics.    Baseline  8/7:R: knee valgus (to midline, R knee doesn't touch LLE), foot eversion, and mild unsteadiness with R knee pain    Time  4    Period  Weeks    Status  Not Met            Plan - 10/25/18 4008    Clinical Impression Statement  Pt due for reassessment of objective measures and goals this date. Pt subjectively reports no change in ability to perform functional activities for work or perform self directed gym routine but with no improvements in pain. Objectively, pt with minimal improvements in single leg squat, decreased R knee valgus, able to maintain R heel contact and less eversion with task. Per ROM testing, pt with improved bil ankle DF, with R being limited compared to L, but able to achieve R knee extension in supine and with 1RM testing. Pt continues to report pain with all functional tests and unable to complete single leg hop tests due to R knee pain increased. PT educated pt to reach back out to orthopedic to obtain MRI results. Pt reports compliance with HEP, but with limited attendance due to personal schedule and plateauing with therapy at this time. Pt is discharged from therapy this date due to reporting wanting to maintain gains at home, inquire about MRI results and plateauing and educated to get new referral if future needs arise.    Personal Factors and Comorbidities  Age;Comorbidity 1;Fitness;Time since onset of injury/illness/exacerbation    Comorbidities  see above    Examination-Activity Limitations  Squat;Stairs    Examination-Participation Restrictions  Community Activity     Stability/Clinical Decision Making  Stable/Uncomplicated    Rehab Potential  Good    PT Frequency  1x / week    PT Duration  4 weeks    PT Treatment/Interventions  ADLs/Self Care Home Management;Aquatic Therapy;Biofeedback;Cryotherapy;Electrical Stimulation;Iontophoresis '4mg'$ /ml Dexamethasone;Moist Heat;Ultrasound;Gait training;Stair training;Functional mobility training;Therapeutic activities;Therapeutic exercise;Balance training;Neuromuscular re-education;Patient/family education;Orthotic Fit/Training;Manual techniques;Passive range of motion;Dry needling;Taping;Joint Manipulations    PT Next Visit Plan  Discharge to HEP    PT Home Exercise Plan  Eval: knee drives in half kneel, calf stretch; 8/7: gastroc and soleus stretch at wall    Recommended Other Services  Inquire  about MRI results    Consulted and Agree with Plan of Care  Patient       Patient will benefit from skilled therapeutic intervention in order to improve the following deficits and impairments:  Decreased activity tolerance, Decreased range of motion, Decreased strength, Improper body mechanics, Pain  Visit Diagnosis: 1. Chronic pain of right knee   2. Other symptoms and signs involving the musculoskeletal system        Problem List Patient Active Problem List   Diagnosis Date Noted  . 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 Localized superficial swelling, mass, or lump   . ANKLE PAIN 07/24/2007     Talbot Grumbling PT, DPT 10/25/18, 9:08 AM [place] [address] [place], [place], [address] Phone: [phone]   Fax:  [phone]  Name: Victor Ashley MRN: [id number] Date of Birth: April 28, 1995

## 2018-10-29 ENCOUNTER — Ambulatory Visit: Payer: BC Managed Care – PPO | Admitting: Orthopaedic Surgery

## 2018-11-07 ENCOUNTER — Ambulatory Visit (INDEPENDENT_AMBULATORY_CARE_PROVIDER_SITE_OTHER): Payer: BC Managed Care – PPO | Admitting: Orthopaedic Surgery

## 2018-11-07 ENCOUNTER — Other Ambulatory Visit: Payer: Self-pay

## 2018-11-07 VITALS — BP 132/85 | HR 86 | Ht 70.0 in | Wt 200.0 lb

## 2018-11-07 DIAGNOSIS — M94261 Chondromalacia, right knee: Secondary | ICD-10-CM

## 2018-11-07 MED ORDER — DICLOFENAC SODIUM 1 % TD GEL: 4.0000 g | Freq: Four times a day (QID) | TRANSDERMAL | Status: AC

## 2018-11-07 NOTE — Progress Notes (Addendum)
Office Visit Note   Patient: Victor SimplerMatthew Nicastro           Date of Birth: 06/07/1995           MRN: [id number] Visit Date: 11/07/2018              Requested by: Richardean Chimeraaniel, Terry G, MD [address] [place],  [place] [id number] PCP: Richardean Chimeraaniel, Terry G, MD   Assessment & Plan: Visit Diagnoses:  1. Chondromalacia, right knee            With lateral patellar tracking.  Plan: Patient taken several months off but has persistent symptoms with participating in martial arts.  He has had anti-inflammatories, been through therapy had intra-articular knee injection.  We discussed operative findings and plan would be knee arthroscopy at his and his father's request with patellar chondral debridement and possible arthroscopic lateral release.  We discussed length of time out of work use of a knee immobilizer postoperatively as well as crutches.  Patient states this is bothering him persistently for many months and is keeping him from participating in athletic activities procedure discussed patient and his father request we proceed.  Follow-Up Instructions: No follow-ups on file.   Orders:  No orders of the defined types were placed in this encounter.  Meds ordered this encounter  Medications  . diclofenac sodium (VOLTAREN) 1 % transdermal gel 4 g    REFILL TIMES 3      Procedures: No procedures performed   Clinical Data: No additional findings.   Subjective: No chief complaint on file.   HPI 23 year old male returns for problems with anterior knee pain right knee.  He is active in weightlifting and martial arts for many years and has had significant pain with explosive knee extension.  MRI scan is been obtained which shows normal meniscus, patellar tendon and normal ligamentous structures.  Patient had patellofemoral mild chondral disease with lateral patellar tilting consistent with patellofemoral maltracking.  He has had persistent problems is here with his father today outlining the problems whenever he  tries to compete in martial arts with jumping or kicking with sharp pain.  Today we reviewed the MRI scan with patient and his father and copies report was supplied.   Review of Systems patient's young active healthy, no cardiovascular respiratory GI GU issues.   Objective: Vital Signs: There were no vitals taken for this visit.  Physical Exam Constitutional:      Appearance: He is well-developed.  HENT:     Head: Normocephalic and atraumatic.  Eyes:     Pupils: Pupils are equal, round, and reactive to light.  Neck:     Thyroid: No thyromegaly.     Trachea: No tracheal deviation.  Cardiovascular:     Rate and Rhythm: Normal rate.  Pulmonary:     Effort: Pulmonary effort is normal.     Breath sounds: No wheezing.  Abdominal:     General: Bowel sounds are normal.     Palpations: Abdomen is soft.  Skin:    General: Skin is warm and dry.     Capillary Refill: Capillary refill takes less than 2 seconds.  Neurological:     Mental Status: He is alert and oriented to person, place, and time.  Psychiatric:        Behavior: Behavior normal.        Thought Content: Thought content normal.        Judgment: Judgment normal.     Ortho Exam patient has pain  with patellar loading quadriceps contracture if repeated and catching in certain areas with pain.  There is some lateral tracking of the patella with knee flexion and extension.  Lateral retinaculum is tight does not allow medial subluxation.  Transverse of tib-fib joint is normal.  No medial or lateral joint line tenderness.  Specialty Comments:  No specialty comments available.  Imaging:   Study Result  CLINICAL DATA:  Persistent knee pain for 6 weeks  EXAM: MRI OF THE RIGHT KNEE WITHOUT CONTRAST  TECHNIQUE: Multiplanar, multisequence MR imaging of the knee was performed. No intravenous contrast was administered.  COMPARISON:  None.  FINDINGS: MENISCI  Medial: There is increased intrameniscal signal seen at  the posterior horn of the medial meniscus, however does not contact the underlying articular surface.  Lateral: Intact.  LIGAMENTS  Cruciates: The ACL is intact. The PCL is intact.  Collaterals: The MCL is intact. The lateral collateral ligamentous complex is intact.  CARTILAGE  Patellofemoral: There is minimal chondral fissuring seen in the central patellar apex.  Medial compartment: Normal.  Lateral compartment: Normal.  BONES: There is minimally increased signal seen along the lateral proximal tibia which is nonspecific. No fracture. No avascular necrosis. No pathologic marrow infiltration.  JOINT: No joint effusion. Normal Hoffa's fat-pad. No plical thickening.  EXTENSOR MECHANISM: The patellar and quadriceps tendon are intact. There is slight lateral subluxation of the patella with mildly increased signal seen at the superolateral corner of Hoffa's fat pad.  POPLITEAL FOSSA: No popliteal cyst.  OTHER:  The visualized muscles are normal in appearance.  IMPRESSION: 1. Intrasubstance sprain of the posterior medial meniscus. No definite meniscal tear. 2. Intact cruciate ligaments 3. Mild chondral disease within the patellofemoral compartment 4. Findings which could be suggestive of minimal patellofemoral maltracking. 5. Nonspecific mild reactive marrow within the proximal lateral tibia. No osseous fracture.   Electronically Signed   By: Prudencio Pair M.D.         PMFS History: Patient Active Problem List   Diagnosis Date Noted  . 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 Localized superficial swelling, mass, or lump   . ANKLE PAIN 07/24/2007   No past medical history on file.  No family history on file.  Past Surgical History:  Procedure Laterality Date  . CYST EXCISION Left 07/29/2015   Procedure: EXCISION OF MASS, LEFT PALM;  Surgeon: Carole Civil, MD;  Location: [facility];  Service: Orthopedics;  Laterality: Left;   Social History    Occupational History  . Not on file  Tobacco Use  . Smoking status: Never Smoker  . Smokeless tobacco: Never Used  Substance and Sexual Activity  . Alcohol use: No  . Drug use: Yes    Types: Marijuana  . Sexual activity: Not on file

## 2018-11-08 ENCOUNTER — Encounter: Payer: Self-pay | Admitting: Orthopaedic Surgery

## 2018-12-02 ENCOUNTER — Telehealth: Payer: Self-pay | Admitting: Orthopaedic Surgery

## 2018-12-02 ENCOUNTER — Other Ambulatory Visit: Payer: Self-pay | Admitting: Orthopaedic Surgery

## 2018-12-02 DIAGNOSIS — S83011A Lateral subluxation of right patella, initial encounter: Secondary | ICD-10-CM

## 2018-12-02 DIAGNOSIS — M2241 Chondromalacia patellae, right knee: Secondary | ICD-10-CM

## 2018-12-02 MED ORDER — OXYCODONE-ACETAMINOPHEN 5-325 MG PO TABS: 1.0000 | ORAL_TABLET | Freq: Four times a day (QID) | ORAL | 0 refills | Status: AC | PRN

## 2018-12-02 NOTE — Telephone Encounter (Signed)
I called discussed.  

## 2018-12-02 NOTE — Telephone Encounter (Signed)
noted 

## 2018-12-02 NOTE — Telephone Encounter (Signed)
Can you please clarify discharge orders for patient's mom?

## 2018-12-02 NOTE — Telephone Encounter (Signed)
Mom called. She needs clarification on the discharge summary. Her number is [phone]. Thanks

## 2018-12-04 ENCOUNTER — Telehealth: Payer: Self-pay

## 2018-12-04 NOTE — Telephone Encounter (Signed)
Please advise 

## 2018-12-04 NOTE — Telephone Encounter (Signed)
I called discussed. Voiding better now. He states he will just stop pain meds.

## 2018-12-04 NOTE — Telephone Encounter (Signed)
noted 

## 2018-12-04 NOTE — Telephone Encounter (Signed)
Patient's father Victor Ashley wanting to know if there is something that they can do differently as far as using ice on the right knee?  Stated that patient has a lot of bandaging on his right knee, so they are not sure if the ice is helping.  Stated that patient is having trouble urinating and is not sure if it is from the medication from his surgery.  Cb# is [phone].  Please advise.  Thank you.

## 2018-12-12 ENCOUNTER — Ambulatory Visit (INDEPENDENT_AMBULATORY_CARE_PROVIDER_SITE_OTHER): Payer: BC Managed Care – PPO | Admitting: Orthopaedic Surgery

## 2018-12-12 ENCOUNTER — Other Ambulatory Visit: Payer: Self-pay

## 2018-12-12 ENCOUNTER — Encounter: Payer: Self-pay | Admitting: Orthopaedic Surgery

## 2018-12-12 DIAGNOSIS — S83001S Unspecified subluxation of right patella, sequela: Secondary | ICD-10-CM

## 2018-12-12 NOTE — Addendum Note (Signed)
Addended by: Meyer Cory on: 12/12/2018 02:15 PM   Modules accepted: Orders

## 2018-12-12 NOTE — Progress Notes (Signed)
   Post-Op Visit Note   Patient: Victor Ashley           Date of Birth: 1996/03/04           MRN: [id number] Visit Date: 12/12/2018 PCP: Caryl Bis, MD   Assessment & Plan: 23 year old male returns post right knee chondroplasty patella with lateral retinacular release done arthroscopically.  Swelling is down he is ready start some physical therapy.  We will set this up in [place] which is closest for him.  I plan to recheck him in 2 months.  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  Physical therapy ordered.  Chief Complaint:  Chief Complaint  Patient presents with  . Right Knee - Routine Post Op    12/02/2018 Right Knee Patellar Chondroplasty   Visit Diagnoses:  1. Patellar subluxation, right, sequela      Post arthroscopic lateral release and patellar chondroplasty.  Plan: Work slip given for work resumption.  He can wear his knee immobilizer except when he gets in and out of vehicles.  Recheck 2 months.  Follow-Up Instructions: No follow-ups on file.   Orders:  No orders of the defined types were placed in this encounter.  No orders of the defined types were placed in this encounter.   Imaging: No results found.  PMFS History: Patient Active Problem List   Diagnosis Date Noted  . Patellar subluxation, right, sequela 12/12/2018  . 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 Localized superficial swelling, mass, or lump   . ANKLE PAIN 07/24/2007   No past medical history on file.  No family history on file.  Past Surgical History:  Procedure Laterality Date  . CYST EXCISION Left 07/29/2015   Procedure: EXCISION OF MASS, LEFT PALM;  Surgeon: Carole Civil, MD;  Location: [facility];  Service: Orthopedics;  Laterality: Left;   Social History   Occupational History  . Not on file  Tobacco Use  . Smoking status: Never Smoker  . Smokeless tobacco: Never Used  Substance and Sexual Activity  . Alcohol use: No  . Drug use: Yes   Types: Marijuana  . Sexual activity: Not on file

## 2018-12-13 ENCOUNTER — Other Ambulatory Visit: Payer: Self-pay

## 2018-12-13 ENCOUNTER — Ambulatory Visit (HOSPITAL_COMMUNITY): Payer: BC Managed Care – PPO | Attending: Orthopaedic Surgery | Admitting: Physical Therapy

## 2018-12-13 ENCOUNTER — Encounter (HOSPITAL_COMMUNITY): Payer: Self-pay | Admitting: Physical Therapy

## 2018-12-13 DIAGNOSIS — M25661 Stiffness of right knee, not elsewhere classified: Secondary | ICD-10-CM | POA: Diagnosis present

## 2018-12-13 DIAGNOSIS — M6281 Muscle weakness (generalized): Secondary | ICD-10-CM | POA: Diagnosis present

## 2018-12-13 DIAGNOSIS — R2689 Other abnormalities of gait and mobility: Secondary | ICD-10-CM

## 2018-12-13 DIAGNOSIS — R600 Localized edema: Secondary | ICD-10-CM | POA: Diagnosis present

## 2018-12-13 NOTE — Patient Instructions (Signed)
Quad Sets    Squeeze pelvic floor and hold. Tighten top of left thigh. Hold for _5__ seconds.  Repeat _15__ times. Do _2-3__ times a day. Repeat with other leg.    Copyright  VHI. All rights reserved.  KNEE: Heel Slide - Supine    Beginning with toes pointed toward ceiling and knees straight, slide involved heel toward buttocks while bending knee. Repeat _10__ times. Do _2-3__ times per day.  Copyright  VHI. All rights reserved.  ANKLE: Pumps    Point toes down, then up. _20__ reps per set, _3-4__ sets per day, _7__ days per week. Feet elevated   Copyright  VHI. All rights reserved.

## 2018-12-13 NOTE — Therapy (Signed)
[facility] [address] [place], [place], [address] Phone: [phone]   Fax:  [phone]  Physical Therapy Evaluation  Patient Details  Name: Victor Ashley MRN: [id number] Date of Birth: Dec 23, 1995 Referring Provider (PT): Eldred Manges, MD   Encounter Date: 12/13/2018  PT End of Session - 12/13/18 1610    Visit Number  1    Number of Visits  18    Date for PT Re-Evaluation  01/24/19    Authorization Type  BCBS (No visit limit)    Authorization Time Period  12/13/18-01/24/19    PT Start Time  1121    PT Stop Time  1200    PT Time Calculation (min)  39 min    Equipment Utilized During Treatment  Right knee immobilizer    Activity Tolerance  Patient tolerated treatment well    Behavior During Therapy  [facility] for tasks assessed/performed       History reviewed. No pertinent past medical history.  Past Surgical History:  Procedure Laterality Date  . CYST EXCISION Left 07/29/2015   Procedure: EXCISION OF MASS, LEFT PALM;  Surgeon: Vickki Hearing, MD;  Location: [facility];  Service: Orthopedics;  Laterality: Left;    There were no vitals filed for this visit.   Subjective Assessment - 12/13/18 1126    Subjective  Patient reported that he had a right knee chondroplasty and lateral release on 12/02/18. Patient reported that he was instructed by his MD that if he is walking a lot he should wear the knee immobilizer. He said the MD told him he would have to lean forward a lot when walking and he said he has noticed that he does that. He wants to be able to get back to work soon.    Limitations  Walking    How long can you sit comfortably?  no issues    How long can you stand comfortably?  10 minutes    How long can you walk comfortably?  10 minutes    Patient Stated Goals  Walking normal    Currently in Pain?  No/denies         [facility] PT Assessment - 12/13/18 0001      Assessment   Medical Diagnosis  Right knee chondroplasty patella  lateral retinacular release    Referring Provider (PT)  Eldred Manges, MD    Onset Date/Surgical Date  12/02/18    Next MD Visit  --   Unknown   Prior Therapy  Before surgery      Precautions   Precaution Comments  Told to wear immobilizer when ambulating a lot      Restrictions   Weight Bearing Restrictions  No      Prior Function   Level of Independence  Independent    Vocation  Part time employment    Vocation Requirements  driving, digging holes, working on Physicist, medical   Overall Cognitive Status  Within Functional Limits for tasks assessed      Observation/Other Assessments   Observations  No excessive redness, some broken blood vessels noted in posterior knee crease    Focus on Therapeutic Outcomes (FOTO)   50% limited      Observation/Other Assessments-Edema    Edema  Circumferential      Circumferential Edema   Circumferential - Right  44 cm at joint line    Circumferential - Left   43 cm at joint line  Sensation   Light Touch  Appears Intact      AROM   Right Knee Extension  15    Right Knee Flexion  81    Left Knee Extension  0    Left Knee Flexion  130      Strength   Right Hip Flexion  5/5    Right Hip Extension  4+/5    Right Hip ABduction  4+/5    Left Hip Flexion  4+/5    Left Hip Extension  5/5    Left Hip ABduction  5/5    Right Knee Flexion  3-/5    Right Knee Extension  3-/5    Left Knee Flexion  5/5    Left Knee Extension  5/5    Right Ankle Dorsiflexion  5/5    Left Ankle Dorsiflexion  5/5      Palpation   Palpation comment  No excessive warmth noted, some edema      Ambulation/Gait   Ambulation/Gait  Yes    Ambulation Distance (Feet)  276 Feet     Assistive device  None    Gait Pattern  Step-through pattern;Right circumduction;Decreased weight shift to right;Decreased hip/knee flexion - right;Decreased step length - left;Right flexed knee in stance;Antalgic    Ambulation Surface  Level;Indoor    Gait velocity   0.7 m/s      Balance   Balance Assessed  Yes      Static Standing Balance   Static Standing - Balance Support  No upper extremity supported    Static Standing Balance -  Activities   Single Leg Stance - Right Leg;Single Leg Stance - Left Leg    Static Standing - Comment/# of Minutes  >1 minute bilaterally however, significantly flexed right knee in SLS                Objective measurements completed on examination: See above findings.              PT Education - 12/13/18 1609    Education Details  Educated on examination findings, POC, and initial HEP.    Person(s) Educated  Patient    Methods  Explanation;Handout    Comprehension  Verbalized understanding       PT Short Term Goals - 12/13/18 1618      PT SHORT TERM GOAL #1   Title  Patient will report understanding and regular compliance with HEP to improve ROM, decrease edema, and improve overall functional mobility.    Time  3    Period  Weeks    Status  New    Target Date  01/03/19      PT SHORT TERM GOAL #2   Title  Patient will demonstrate right knee AROM 5-90 degrees to improve gait mechanics and overall functional mobility.    Time  3    Period  Weeks    Status  New    Target Date  01/03/19        PT Long Term Goals - 12/13/18 1628      PT LONG TERM GOAL #1   Title  Patient will demonstrate right knee AROM 0-125 degrees to improve gait mechanics and overall functional mobility.    Time  6    Period  Weeks    Status  New    Target Date  01/24/19      PT LONG TERM GOAL #2   Title  Patient will demonstrate improvement in MMT strength by 1  grade to improve ability to perform transfers and overall functional mobility to return to work.    Time  6    Period  Weeks    Status  New    Target Date  01/24/19      PT LONG TERM GOAL #3   Title  Patient will demonstrate ability to ambulate at a gait speed of at least 1.2 m/s on the 2MWT with minimal to no gait deviations in order to ambulate  more easily in the community safely.    Time  6    Period  Weeks    Status  New    Target Date  01/24/19      PT LONG TERM GOAL #4   Title  Patient will demonstrate ability to perform SLS for at least 1 minute with minimal to no sway and no notable knee flexion.    Time  6    Period  Weeks    Status  New    Target Date  01/24/19             Plan - 12/13/18 1640    Clinical Impression Statement  Patient is a 23 year old male S/P right chondroplasty patella and lateral retinacular release. Upon examination patient presents with decreased right knee AROM, increased edema, and decreased strength. Patient demonstrated decreased gait velocity on the 2MWT and noted gait deviations. Patient has been unable to return to work, but would like to return soon. In addition, patient reported a previously very active lifestyle with daily activity which he would like to be able to resume. Patient would benefit from skilled physical therapy in order to address the abovementioned deficits and help patient return to his PLOF.    Examination-Activity Limitations  Squat;Stairs;Locomotion Level;Stand;Transfers    Examination-Participation Restrictions  Community Activity    Stability/Clinical Decision Making  Stable/Uncomplicated    Clinical Decision Making  Low    Rehab Potential  Good    PT Frequency  3x / week   Possibility to decrease to 2x/week as needed   PT Duration  6 weeks    PT Treatment/Interventions  ADLs/Self Care Home Management;Aquatic Therapy;Biofeedback;Cryotherapy;Electrical Stimulation;Iontophoresis 4mg /ml Dexamethasone;Moist Heat;Ultrasound;Gait training;Stair training;Functional mobility training;Therapeutic activities;Therapeutic exercise;Balance training;Neuromuscular re-education;Patient/family education;Orthotic Fit/Training;Manual techniques;Passive range of motion;Dry needling;Taping;Joint Manipulations;DME Instruction;Scar mobilization;Energy conservation    PT Next Visit Plan   Review Eval/goals and initial HEP. Focus on ROM and edema reduction initially progressing as able to strengthening and balance.    PT Home Exercise Plan  12/13/18: Ankle pumps, heel slides, quad sets    Consulted and Agree with Plan of Care  Patient       Patient will benefit from skilled therapeutic intervention in order to improve the following deficits and impairments:  Decreased activity tolerance, Decreased range of motion, Decreased strength, Improper body mechanics, Pain, Abnormal gait, Decreased scar mobility, Decreased endurance, Hypomobility, Decreased balance, Difficulty walking, Increased edema, Impaired flexibility  Visit Diagnosis: Stiffness of right knee, not elsewhere classified  Localized edema  Muscle weakness (generalized)  Other abnormalities of gait and mobility     Problem List Patient Active Problem List   Diagnosis Date Noted  . Patellar subluxation, right, sequela 12/12/2018  . 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 Localized superficial swelling, mass, or lump   . ANKLE PAIN 07/24/2007   Clarene Critchley PT, DPT 4:44 PM, 12/13/18 [place] [address] [place], [place], [address] Phone: [phone]   Fax:  [phone]  Name: Keatyn  Loney Heringetty MRN: [id number] Date of Birth: 12/06/1995

## 2018-12-17 ENCOUNTER — Other Ambulatory Visit: Payer: Self-pay

## 2018-12-17 ENCOUNTER — Encounter (HOSPITAL_COMMUNITY): Payer: Self-pay

## 2018-12-17 ENCOUNTER — Ambulatory Visit (HOSPITAL_COMMUNITY): Payer: BC Managed Care – PPO

## 2018-12-17 DIAGNOSIS — R600 Localized edema: Secondary | ICD-10-CM

## 2018-12-17 DIAGNOSIS — M25661 Stiffness of right knee, not elsewhere classified: Secondary | ICD-10-CM | POA: Diagnosis not present

## 2018-12-17 DIAGNOSIS — R2689 Other abnormalities of gait and mobility: Secondary | ICD-10-CM

## 2018-12-17 DIAGNOSIS — M6281 Muscle weakness (generalized): Secondary | ICD-10-CM

## 2018-12-17 NOTE — Therapy (Signed)
[employer], [place], [address] Phone: [phone]   Fax:  [phone]  Physical Therapy Treatment  Patient Details  Name: Victor Ashley MRN: [id number] Date of Birth: [birth year] Referring Provider (PT): Marybelle Killings, MD   Encounter Date: 12/17/2018  PT End of Session - 12/17/18 1324    Visit Number  2    Number of Visits  18    Date for PT Re-Evaluation  01/24/19    Authorization Type  BCBS (No visit limit)    Authorization Time Period  12/13/18-01/24/19    PT Start Time  1316    PT Stop Time  1358    PT Time Calculation (min)  42 min    Equipment Utilized During Treatment  --   Pt arrived without immobilizer   Activity Tolerance  Patient tolerated treatment well    Behavior During Therapy  [facility] for tasks assessed/performed       History reviewed. No pertinent past medical history.  Past Surgical History:  Procedure Laterality Date  . CYST EXCISION Left 07/29/2015   Procedure: EXCISION OF MASS, LEFT PALM;  Surgeon: Carole Civil, MD;  Location: [facility];  Service: Orthopedics;  Laterality: Left;    There were no vitals filed for this visit.  Subjective Assessment - 12/17/18 1321    Subjective  Pt reports knee was swollen a lot on Sunday, been on feet a lot.  No reports of pain currently.  Supposed to RTW 12/23/18.    Patient Stated Goals  Walking normal    Currently in Pain?  No/denies    Multiple Pain Sites  No         [facility] PT Assessment - 12/17/18 0001      Assessment   Medical Diagnosis  Right knee chondroplasty patella lateral retinacular release    Referring Provider (PT)  Marybelle Killings, MD    Onset Date/Surgical Date  12/02/18    Next MD Visit  11/19    Prior Therapy  Before surgery      Precautions   Precaution Comments  Told to wear immobilizer when ambulating a lot                   [facility] Adult PT Treatment/Exercise - 12/17/18 0001      Exercises   Exercises  Knee/Hip       Knee/Hip Exercises: Standing   Gait Training  Gait training for heel to toe mechanics      Knee/Hip Exercises: Supine   Quad Sets  2 sets;10 reps    Quad Sets Limitations  towel under knee     Short Arc Quad Sets  2 sets;10 reps    Short Arc Quad Sets Limitations  neutral and ER    Heel Slides  10 reps    Straight Leg Raises Limitations  unable    Knee Extension Limitations  12    Knee Flexion Limitations  112      Manual Therapy   Manual Therapy  Edema management;Joint mobilization    Manual therapy comments  Manual complete separate than rest of tx    Edema Management  Retrograde massage for edema control, LE elevated    Joint Mobilization  patella mobs all directions             PT Education - 12/17/18 1342    Education Details  reviewed goals, assured compliance with HEP.  Reviewed RICE techniques to assist with edema and  pain.  Educated benefits with compression hose, [employer]    Person(s) Educated  Patient    Methods  Explanation;Handout    Comprehension  Verbalized understanding;Returned demonstration       PT Short Term Goals - 12/13/18 1618      PT SHORT TERM GOAL #1   Title  Patient will report understanding and regular compliance with HEP to improve ROM, decrease edema, and improve overall functional mobility.    Time  3    Period  Weeks    Status  New    Target Date  01/03/19      PT SHORT TERM GOAL #2   Title  Patient will demonstrate right knee AROM 5-90 degrees to improve gait mechanics and overall functional mobility.    Time  3    Period  Weeks    Status  New    Target Date  01/03/19        PT Long Term Goals - 12/13/18 1628      PT LONG TERM GOAL #1   Title  Patient will demonstrate right knee AROM 0-125 degrees to improve gait mechanics and overall functional mobility.    Time  6    Period  Weeks    Status  New    Target Date  01/24/19      PT LONG TERM GOAL #2   Title  Patient will demonstrate improvement in MMT  strength by 1 grade to improve ability to perform transfers and overall functional mobility to return to work.    Time  6    Period  Weeks    Status  New    Target Date  01/24/19      PT LONG TERM GOAL #3   Title  Patient will demonstrate ability to ambulate at a gait speed of at least 1.2 m/s on the 2MWT with minimal to no gait deviations in order to ambulate more easily in the community safely.    Time  6    Period  Weeks    Status  New    Target Date  01/24/19      PT LONG TERM GOAL #4   Title  Patient will demonstrate ability to perform SLS for at least 1 minute with minimal to no sway and no notable knee flexion.    Time  6    Period  Weeks    Status  New    Target Date  01/24/19            Plan - 12/17/18 1844    Clinical Impression Statement  Reviewed goals and compliance wiht HEP, pt able to recall and demonstrate appropraite mechanics wiht HEP.  Began session with gait training to improve mechanics.  Pt demonstrates weak quad activaiton that was difficult to extend with gait.  Session focus on quad strengthening and ROM based exercises.  Pt making great gains with knee flexion with AROM 12-112 degrees (was 15-81 inital eval).  Improved quad activaiton with verbal and tactile cueing, encouraged pt to place towel under knee.  Pt unable to complete SLR due to quad weakness.  Pt also limited by edema, reviewed RICE technqiues to assist with pain and educated benefits with compression garments.  Measurements taken and paperwork given for [employer]  Discussed return to work early October, pt encouraged to hold off to reduce risk of injury as LE very weak, pt verbalized understanding and agreed.    Personal Factors and Comorbidities  Age;Comorbidity  1;Fitness;Time since onset of injury/illness/exacerbation    Comorbidities  see above    Examination-Activity Limitations  Squat;Stairs;Locomotion Level;Stand;Transfers    Examination-Participation Restrictions  Community  Activity    Stability/Clinical Decision Making  Stable/Uncomplicated    Clinical Decision Making  Low    Rehab Potential  Good    PT Frequency  3x / week   Possibiilty to reduce to 2x PRN   PT Duration  6 weeks    PT Treatment/Interventions  ADLs/Self Care Home Management;Aquatic Therapy;Biofeedback;Cryotherapy;Electrical Stimulation;Iontophoresis 4mg /ml Dexamethasone;Moist Heat;Ultrasound;Gait training;Stair training;Functional mobility training;Therapeutic activities;Therapeutic exercise;Balance training;Neuromuscular re-education;Patient/family education;Orthotic Fit/Training;Manual techniques;Passive range of motion;Dry needling;Taping;Joint Manipulations;DME Instruction;Scar mobilization;Energy conservation    PT Next Visit Plan  Add russian estim next session for NMR.   Focus on ROM and edema reduction initially progressing as able to strengthening and balance.    PT Home Exercise Plan  12/13/18: Ankle pumps, heel slides, quad sets       Patient will benefit from skilled therapeutic intervention in order to improve the following deficits and impairments:  Decreased activity tolerance, Decreased range of motion, Decreased strength, Improper body mechanics, Pain, Abnormal gait, Decreased scar mobility, Decreased endurance, Hypomobility, Decreased balance, Difficulty walking, Increased edema, Impaired flexibility  Visit Diagnosis: Stiffness of right knee, not elsewhere classified  Localized edema  Other abnormalities of gait and mobility  Muscle weakness (generalized)     Problem List Patient Active Problem List   Diagnosis Date Noted  . Patellar subluxation, right, sequela 12/12/2018  . 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 Localized superficial swelling, mass, or lump   . ANKLE PAIN 07/24/2007   09/23/2007, LPTA; CBIS [phone]  [phone] 12/17/2018, 6:53 PM  [facility] [address] [place], [place],  [place] Phone: [phone]   Fax:  [phone]  Name: Keone Kamer MRN: Viviana Simpler Date of Birth: [birth year]

## 2018-12-19 ENCOUNTER — Other Ambulatory Visit: Payer: Self-pay

## 2018-12-19 ENCOUNTER — Encounter (HOSPITAL_COMMUNITY): Payer: Self-pay

## 2018-12-19 ENCOUNTER — Ambulatory Visit (HOSPITAL_COMMUNITY): Payer: BC Managed Care – PPO | Attending: Orthopaedic Surgery

## 2018-12-19 DIAGNOSIS — R2689 Other abnormalities of gait and mobility: Secondary | ICD-10-CM | POA: Insufficient documentation

## 2018-12-19 DIAGNOSIS — R600 Localized edema: Secondary | ICD-10-CM | POA: Diagnosis present

## 2018-12-19 DIAGNOSIS — M6281 Muscle weakness (generalized): Secondary | ICD-10-CM | POA: Insufficient documentation

## 2018-12-19 DIAGNOSIS — M25661 Stiffness of right knee, not elsewhere classified: Secondary | ICD-10-CM | POA: Diagnosis present

## 2018-12-19 NOTE — Patient Instructions (Signed)
Hamstring Step 3    Left leg in maximal straight leg raise, heel at maximal stretch, straighten knee further by tightening knee cap. Warning: Intense stretch. Stay within tolerance. Hold 30 seconds. Relax knee cap only. Repeat 3 times.  Copyright  VHI. All rights reserved.   Gastroc, Standing    Stand, right foot behind, heel on floor and turned slightly out, leg straight, forward leg bent. Keeping arms straight, push pelvis forward until stretch is felt in calf. Hold 30 seconds. Repeat 3 times per session. Do 1-2 sessions per day.  Copyright  VHI. All rights reserved.   Straight Leg Raise    Complete a quad set (tighten muscle on thigh) and slowly raise locked right leg 12 inches from floor. Repeat 10 times per set. Do 1-2 sets per session.   http://orth.exer.us/1102   Copyright  VHI. All rights reserved.

## 2018-12-19 NOTE — Therapy (Signed)
[facility] [address] [place], [place], [address] Phone: [phone]   Fax:  [phone]  Physical Therapy Treatment  Patient Details  Name: Victor Ashley MRN: [id number] Date of Birth: 01-23-1996 Referring Provider (PT): Eldred Manges, MD   Encounter Date: 12/19/2018  PT End of Session - 12/19/18 1546    Visit Number  3    Number of Visits  18    Date for PT Re-Evaluation  01/24/19    Authorization Type  BCBS (No visit limit)    Authorization Time Period  12/13/18-01/24/19    PT Start Time  1538    PT Stop Time  1616    PT Time Calculation (min)  38 min    Equipment Utilized During Treatment  --   No knee immobilizer   Activity Tolerance  Patient tolerated treatment well    Behavior During Therapy  [facility] for tasks assessed/performed       History reviewed. No pertinent past medical history.  Past Surgical History:  Procedure Laterality Date  . CYST EXCISION Left 07/29/2015   Procedure: EXCISION OF MASS, LEFT PALM;  Surgeon: Vickki Hearing, MD;  Location: [facility];  Service: Orthopedics;  Laterality: Left;    There were no vitals filed for this visit.  Subjective Assessment - 12/19/18 1545    Subjective  Pt reports increased swelling on Rt knee.  Has been compliant with HEP.  Supposed to RTW 01/06/19 3x/ week for 10-12 hr days.    Currently in Pain?  No/denies                       [facility] Adult PT Treatment/Exercise - 12/19/18 0001      Exercises   Exercises  Knee/Hip      Knee/Hip Exercises: Stretches   Active Hamstring Stretch  Right;3 reps;30 seconds    Active Hamstring Stretch Limitations  supine wiht rope    Gastroc Stretch  3 reps;30 seconds    Gastroc Stretch Limitations  against wall      Knee/Hip Exercises: Supine   Quad Sets  10 reps;AROM;3 sets    [employer] Limitations  AROM, then with [place] estim    Short Arc [employer] Limitations  Russian x 10' with AROM during rest 10/30    Heel Slides   10 reps    Straight Leg Raises  2 sets;AAROM;10 reps    Straight Leg Raises Limitations  Began with [place] estim then able to complete AROM with minimal extension lag    Knee Extension  AROM    Knee Extension Limitations  6    Knee Flexion  AROM    Knee Flexion Limitations  115      Modalities   Modalities  Electrical Stimulation      Electrical Stimulation   Electrical Stimulation Location  Proximal and distal quad    Electrical Stimulation Action  NMR    Electrical Stimulation Parameters  10 min 10/30 ration, co-contration NMR    Electrical Stimulation Goals  Neuromuscular facilitation;Strength      Manual Therapy   Manual Therapy  Edema management;Joint mobilization    Manual therapy comments  Manual complete separate than rest of tx    Edema Management  Retrograde massage for edema control, LE elevated    Joint Mobilization  patella mobs all directions, restricted medial to lateral               PT Short Term Goals -  12/13/18 1618      PT SHORT TERM GOAL #1   Title  Patient will report understanding and regular compliance with HEP to improve ROM, decrease edema, and improve overall functional mobility.    Time  3    Period  Weeks    Status  New    Target Date  01/03/19      PT SHORT TERM GOAL #2   Title  Patient will demonstrate right knee AROM 5-90 degrees to improve gait mechanics and overall functional mobility.    Time  3    Period  Weeks    Status  New    Target Date  01/03/19        PT Long Term Goals - 12/13/18 1628      PT LONG TERM GOAL #1   Title  Patient will demonstrate right knee AROM 0-125 degrees to improve gait mechanics and overall functional mobility.    Time  6    Period  Weeks    Status  New    Target Date  01/24/19      PT LONG TERM GOAL #2   Title  Patient will demonstrate improvement in MMT strength by 1 grade to improve ability to perform transfers and overall functional mobility to return to work.    Time  6    Period   Weeks    Status  New    Target Date  01/24/19      PT LONG TERM GOAL #3   Title  Patient will demonstrate ability to ambulate at a gait speed of at least 1.2 m/s on the 2MWT with minimal to no gait deviations in order to ambulate more easily in the community safely.    Time  6    Period  Weeks    Status  New    Target Date  01/24/19      PT LONG TERM GOAL #4   Title  Patient will demonstrate ability to perform SLS for at least 1 minute with minimal to no sway and no notable knee flexion.    Time  6    Period  Weeks    Status  New    Target Date  01/24/19            Plan - 12/19/18 1637    Clinical Impression Statement  Added [place] estim for NMR to improve quadruped activation.  Pt progressed well following NMR with ability complete SLR with minimal extension lag (was unable to complete last session.)  AROM improved for 6-115 degrees with tactile and verbal cueing with extension.  Added hamstring and gastroc stretches for mobility, added to HEP as well as SLR.  Pt limited by edema proximal knee, EOS with retrograde massage for edema control.  Encouraged ice following session for edema and pain control.    Personal Factors and Comorbidities  Age;Comorbidity 1;Fitness;Time since onset of injury/illness/exacerbation    Comorbidities  see above    Examination-Activity Limitations  Squat;Stairs;Locomotion Level;Stand;Transfers    Examination-Participation Restrictions  Community Activity    Stability/Clinical Decision Making  Stable/Uncomplicated    Clinical Decision Making  Low    Rehab Potential  Good    PT Frequency  3x / week   Possibilty to reduce to 2x/ week PRN   PT Duration  6 weeks    PT Treatment/Interventions  ADLs/Self Care Home Management;Aquatic Therapy;Biofeedback;Cryotherapy;Electrical Stimulation;Iontophoresis 4mg /ml Dexamethasone;Moist Heat;Ultrasound;Gait training;Stair training;Functional mobility training;Therapeutic activities;Therapeutic exercise;Balance  training;Neuromuscular re-education;Patient/family education;Orthotic Fit/Training;Manual techniques;Passive range of motion;Dry  needling;Taping;Joint Manipulations;DME Instruction;Scar mobilization;Energy conservation    PT Next Visit Plan  Next session add slant board and TKE exercises.  Focus on ROM, edema reduction then gait, strengthening and balance.  [place] estim may not be needed next session PRN.    PT Home Exercise Plan  12/13/18: Ankle pumps, heel slides, quad sets; 10/01: hamstring and gastroc stretches, SLR       Patient will benefit from skilled therapeutic intervention in order to improve the following deficits and impairments:  Decreased activity tolerance, Decreased range of motion, Decreased strength, Improper body mechanics, Pain, Abnormal gait, Decreased scar mobility, Decreased endurance, Hypomobility, Decreased balance, Difficulty walking, Increased edema, Impaired flexibility  Visit Diagnosis: Stiffness of right knee, not elsewhere classified  Localized edema  Other abnormalities of gait and mobility  Muscle weakness (generalized)     Problem List Patient Active Problem List   Diagnosis Date Noted  . Patellar subluxation, right, sequela 12/12/2018  . 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 Localized superficial swelling, mass, or lump   . ANKLE PAIN 07/24/2007   Becky Saxasey , LPTA; CBIS [phone]  Juel Burrow,  Jo 12/19/2018, 4:47 PM   [facility] [address] [place], [place], [address] Phone: [phone]   Fax:  [phone]  Name: Victor SimplerMatthew Ashley MRN: [id number] Date of Birth: 02/10/1996

## 2018-12-24 ENCOUNTER — Ambulatory Visit (HOSPITAL_COMMUNITY): Payer: BC Managed Care – PPO

## 2018-12-24 ENCOUNTER — Encounter (HOSPITAL_COMMUNITY): Payer: Self-pay

## 2018-12-24 ENCOUNTER — Other Ambulatory Visit: Payer: Self-pay

## 2018-12-24 DIAGNOSIS — R2689 Other abnormalities of gait and mobility: Secondary | ICD-10-CM

## 2018-12-24 DIAGNOSIS — M25661 Stiffness of right knee, not elsewhere classified: Secondary | ICD-10-CM | POA: Diagnosis not present

## 2018-12-24 DIAGNOSIS — M6281 Muscle weakness (generalized): Secondary | ICD-10-CM

## 2018-12-24 DIAGNOSIS — R600 Localized edema: Secondary | ICD-10-CM

## 2018-12-24 NOTE — Therapy (Addendum)
[place], [place], [address] Phone: [phone]   Fax:  [phone]  Physical Therapy Treatment  Patient Details  Name: Victor Ashley MRN: [id number] Date of Birth: 05-24-1995 Referring Provider (PT): Marybelle Killings, MD   Encounter Date: 12/24/2018  PT End of Session - 12/24/18 1503    Visit Number  4    Number of Visits  18    Date for PT Re-Evaluation  01/24/19 (Mini re-assess 01/03/19)    Authorization Type  BCBS (No visit limit)    Authorization Time Period  12/13/18-01/24/19    PT Start Time  1406    PT Stop Time  1448    PT Time Calculation (min)  42 min    Activity Tolerance  Patient tolerated treatment well    Behavior During Therapy  [facility] for tasks assessed/performed       History reviewed. No pertinent past medical history.  Past Surgical History:  Procedure Laterality Date  . CYST EXCISION Left 07/29/2015   Procedure: EXCISION OF MASS, LEFT PALM;  Surgeon: Carole Civil, MD;  Location: [facility];  Service: Orthopedics;  Laterality: Left;    There were no vitals filed for this visit.  Subjective Assessment - 12/24/18 1405    Subjective  Pt stated he stood for several hours at church event, reports increased swelling following standing.  Hopes to RTW on 01/06/19.    Patient Stated Goals  Walking normal    Currently in Pain?  Yes    Pain Score  3     Pain Location  Knee    Pain Orientation  Right    Pain Descriptors / Indicators  Sore;Tightness    Pain Type  Acute pain    Pain Radiating Towards  N/A    Pain Onset  More than a month ago    Pain Frequency  Intermittent    Aggravating Factors   full extension, benidng, weight on it, pivoting in standing    Pain Relieving Factors  rest, NSAIDs    Effect of Pain on Daily Activities  minimal effect, pushed through                       [facility] Adult PT Treatment/Exercise - 12/24/18 0001      Exercises   Exercises  Knee/Hip      Knee/Hip Exercises: Stretches   Active Hamstring Stretch  Right;3 reps;30 seconds    Active Hamstring Stretch Limitations  supine wiht rope    Gastroc Stretch  3 reps;30 seconds    Gastroc Stretch Limitations  slant board      Knee/Hip Exercises: Standing   Heel Raises  Right;15 reps    Heel Raises Limitations  incline slope; toe raises    Terminal Knee Extension  10 reps    Terminal Knee Extension Limitations  pushing pink ball into wall    Gait Training  Gait training with cueing for toe push off and heel strike      Knee/Hip Exercises: Seated   Long Arc Quad  10 reps    Long Arc Quad Limitations  c/o catching medail scar tissue upon relax      Knee/Hip Exercises: Supine   Quad Sets  10 reps;AROM;3 sets    Short Arc [employer]  2 sets;15 reps    Short Arc Quad Sets Limitations  3" holds    Straight Leg Raises  4 sets;5 reps    Straight Leg  Raises Limitations  cueing for quad set prior SLR    Knee Extension  AROM    Knee Extension Limitations  5    Knee Flexion Limitations  130      Manual Therapy   Manual Therapy  Edema management;Joint mobilization    Manual therapy comments  Manual complete separate than rest of tx    Edema Management  Retrograde massage for edema control, LE elevated    Joint Mobilization  patella mobs all directions, restricted medial to lateral               PT Short Term Goals - 12/13/18 1618      PT SHORT TERM GOAL #1   Title  Patient will report understanding and regular compliance with HEP to improve ROM, decrease edema, and improve overall functional mobility.    Time  3    Period  Weeks    Status  New    Target Date  01/03/19      PT SHORT TERM GOAL #2   Title  Patient will demonstrate right knee AROM 5-90 degrees to improve gait mechanics and overall functional mobility.    Time  3    Period  Weeks    Status  New    Target Date  01/03/19        PT Long Term Goals - 12/13/18 1628      PT LONG TERM GOAL #1   Title  Patient  will demonstrate right knee AROM 0-125 degrees to improve gait mechanics and overall functional mobility.    Time  6    Period  Weeks    Status  New    Target Date  01/24/19      PT LONG TERM GOAL #2   Title  Patient will demonstrate improvement in MMT strength by 1 grade to improve ability to perform transfers and overall functional mobility to return to work.    Time  6    Period  Weeks    Status  New    Target Date  01/24/19      PT LONG TERM GOAL #3   Title  Patient will demonstrate ability to ambulate at a gait speed of at least 1.2 m/s on the with minimal to no gait deviations in order to ambulate more easily in the community safely.    Time  6    Period  Weeks    Status  New    Target Date  01/24/19      PT LONG TERM GOAL #4   Title  Patient will demonstrate ability to perform SLS for at least 1 minute with minimal to no sway and no notable knee flexion.    Time  6    Period  Weeks    Status  New    Target Date  01/24/19            Plan - 12/24/18 1732    Clinical Impression Statement  Began session with gait training to improve heel to toe mechanics and reduce circumduction mechanics.  Added LAQ and TKE exercises for quad strengthening.  AROM improving with flexion 5-128 degrees.  Pt continues to have edema proximal knee limiting extension.  Pt encouraged to wait to return to work for injury prevention due to weak quad, decreased ROM, edema present and impaired gait mechanics.  Manual retrograde massage for edema control, encouraged pt to look into compression hose if plans to return to standing 10-12 hour days.  Personal Factors and Comorbidities  Age;Comorbidity 1;Fitness;Time since onset of injury/illness/exacerbation    Comorbidities  see above    Examination-Activity Limitations  Squat;Stairs;Locomotion Level;Stand;Transfers    Examination-Participation Restrictions  Community Activity    Stability/Clinical Decision Making  Stable/Uncomplicated    Clinical  Decision Making  Low    Rehab Potential  Good    PT Frequency  3x / week   Possible to reduce to 2x/week PRN   PT Duration  6 weeks    PT Treatment/Interventions  ADLs/Self Care Home Management;Aquatic Therapy;Biofeedback;Cryotherapy;Electrical Stimulation;Iontophoresis 4mg /ml Dexamethasone;Moist Heat;Ultrasound;Gait training;Stair training;Functional mobility training;Therapeutic activities;Therapeutic exercise;Balance training;Neuromuscular re-education;Patient/family education;Orthotic Fit/Training;Manual techniques;Passive range of motion;Dry needling;Taping;Joint Manipulations;DME Instruction;Scar mobilization;Energy conservation    PT Next Visit Plan  Next session add rockerboard, continue quad extension exercises.  Focus on ROM, edema reduction then gait, strengthening and balance.  [place] estim may not be needed next session PRN.    PT Home Exercise Plan  12/13/18: Ankle pumps, heel slides, quad sets; 10/01: hamstring and gastroc stretches, SLR; 12/24/18: LAQ and TKE wiht ball or towel against wall.       Patient will benefit from skilled therapeutic intervention in order to improve the following deficits and impairments:  Decreased activity tolerance, Decreased range of motion, Decreased strength, Improper body mechanics, Pain, Abnormal gait, Decreased scar mobility, Decreased endurance, Hypomobility, Decreased balance, Difficulty walking, Increased edema, Impaired flexibility  Visit Diagnosis: Stiffness of right knee, not elsewhere classified  Localized edema  Other abnormalities of gait and mobility  Muscle weakness (generalized)     Problem List Patient Active Problem List   Diagnosis Date Noted  . Patellar subluxation, right, sequela 12/12/2018  . 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 Localized superficial swelling, mass, or lump   . ANKLE PAIN 07/24/2007   Becky Saxasey Jeyla Bulger, LPTA; CBIS [phone]  Juel BurrowCockerham, Harris Kistler Jo 12/24/2018, 5:41 PM  [facility] [address] [place], [place], [address] Phone: [phone]   Fax:  [phone]  Name: [name] MRN: [id number] Date of Birth: 07/29/1995

## 2018-12-24 NOTE — Patient Instructions (Signed)
Long [employer]    Straighten operated leg and try to hold it 5 seconds.  Repeat 10 times. Do 2 sessions a day, 4 days a week.  http://gt2.exer.us/310   Copyright  VHI. All rights reserved.   Knee Extension: Quad Set (Eccentric), (Resistance Band)    Stand holding support, tubing behind fully extended knee. Slowly bend knee for 3-5 seconds. Use ball or towel behind Right knee, extend knee for 5-10" 10 reps per set, 2 sets per day,4-5 days per week.  http://ecce.exer.us/132   Copyright  VHI. All rights reserved.   Supine    Lie on back, legs bent and feet flat. Grasp behind one leg and slowly try to straighten knee. Hold 30 seconds.  Repeat 3 times per session. Do 2 sessions per day.  Copyright  VHI. All rights reserved.   Gastroc, Standing    Stand, right foot behind, heel on floor and turned slightly out, leg straight, forward leg bent. Keeping arms straight, push pelvis forward until stretch is felt in calf. Hold 30 seconds. Repeat 3 times per session. Do 2 sessions per day.  Copyright  VHI. All rights reserved.

## 2018-12-27 ENCOUNTER — Ambulatory Visit (HOSPITAL_COMMUNITY): Payer: BC Managed Care – PPO | Admitting: Physical Therapy

## 2018-12-27 ENCOUNTER — Other Ambulatory Visit: Payer: Self-pay

## 2018-12-27 ENCOUNTER — Encounter (HOSPITAL_COMMUNITY): Payer: Self-pay | Admitting: Physical Therapy

## 2018-12-27 DIAGNOSIS — R2689 Other abnormalities of gait and mobility: Secondary | ICD-10-CM

## 2018-12-27 DIAGNOSIS — M25661 Stiffness of right knee, not elsewhere classified: Secondary | ICD-10-CM

## 2018-12-27 DIAGNOSIS — R600 Localized edema: Secondary | ICD-10-CM

## 2018-12-27 DIAGNOSIS — M6281 Muscle weakness (generalized): Secondary | ICD-10-CM

## 2018-12-27 NOTE — Therapy (Signed)
[facility] [address] [place], [place], [address] Phone: [phone]   Fax:  [phone]  Physical Therapy Treatment  Patient Details  Name: Victor Ashley MRN: [id number] Date of Birth: 12/04/1995 Referring Provider (PT): Eldred MangesMark C Yates, MD   Encounter Date: 12/27/2018  PT End of Session - 12/27/18 1358    Visit Number  5    Number of Visits  18    Date for PT Re-Evaluation  01/24/19   Mini re-assess 01/03/19   Authorization Type  BCBS (No visit limit)    Authorization Time Period  12/13/18-01/24/19    PT Start Time  1345    PT Stop Time  1425    PT Time Calculation (min)  40 min    Activity Tolerance  Patient tolerated treatment well    Behavior During Therapy  [facility] for [facility] assessed/performed       History reviewed. No pertinent past medical history.  Past Surgical History:  Procedure Laterality Date  . CYST EXCISION Left 07/29/2015   Procedure: EXCISION OF MASS, LEFT PALM;  Surgeon: Vickki HearingStanley E Harrison, MD;  Location: [facility];  Service: Orthopedics;  Laterality: Left;    There were no vitals filed for this visit.  Subjective Assessment - 12/27/18 1352    Subjective  Patient reported that he feels like his mobility is getting better, but he did report having some stiffness, but no pain.    Patient Stated Goals  Walking normal    Currently in Pain?  No/denies    Pain Onset  --                       [facility] Adult PT Treatment/Exercise - 12/27/18 0001      Exercises   Exercises  Knee/Hip      Knee/Hip Exercises: Stretches   Active Hamstring Stretch  Right;3 reps;30 seconds    Active Hamstring Stretch Limitations  supine with rope    Gastroc Stretch  3 reps;30 seconds    Gastroc Stretch Limitations  slant board      Knee/Hip Exercises: Standing   Heel Raises  Right;15 reps    Heel Raises Limitations  incline slope; toe raises    Gait Training  Gait training 226' with cueing for toe push off and heel strike       Knee/Hip Exercises: Seated   Long Arc Quad  10 reps    Long Arc Quad Limitations  c/o catching medial scar tissue upon relax      Knee/Hip Exercises: Supine   Quad Sets  10 reps;AROM;3 sets    Straight Leg Raises  4 sets;5 reps    Straight Leg Raises Limitations  cueing for quad set prior SLR    Knee Extension  AROM    Knee Extension Limitations  2    Knee Flexion  AROM    Knee Flexion Limitations  134      Manual Therapy   Manual Therapy  Edema management;Joint mobilization    Manual therapy comments  Manual complete separate than rest of tx    Edema Management  Retrograde massage for edema control RLE, Bilateral LE elevated    Joint Mobilization  Patellofemoral joint mobilizations grade II-III all directions, restricted medial to lateral               PT Short Term Goals - 12/13/18 1618      PT SHORT TERM GOAL #1   Title  Patient will report  understanding and regular compliance with HEP to improve ROM, decrease edema, and improve overall functional mobility.    Time  3    Period  Weeks    Status  New    Target Date  01/03/19      PT SHORT TERM GOAL #2   Title  Patient will demonstrate right knee AROM 5-90 degrees to improve gait mechanics and overall functional mobility.    Time  3    Period  Weeks    Status  New    Target Date  01/03/19        PT Long Term Goals - 12/13/18 1628      PT LONG TERM GOAL #1   Title  Patient will demonstrate right knee AROM 0-125 degrees to improve gait mechanics and overall functional mobility.    Time  6    Period  Weeks    Status  New    Target Date  01/24/19      PT LONG TERM GOAL #2   Title  Patient will demonstrate improvement in MMT strength by 1 grade to improve ability to perform transfers and overall functional mobility to return to work.    Time  6    Period  Weeks    Status  New    Target Date  01/24/19      PT LONG TERM GOAL #3   Title  Patient will demonstrate ability to ambulate at a gait speed of at  least 1.2 m/s on the 2MWT with minimal to no gait deviations in order to ambulate more easily in the community safely.    Time  6    Period  Weeks    Status  New    Target Date  01/24/19      PT LONG TERM GOAL #4   Title  Patient will demonstrate ability to perform SLS for at least 1 minute with minimal to no sway and no notable knee flexion.    Time  6    Period  Weeks    Status  New    Target Date  01/24/19            Plan - 12/27/18 1439    Clinical Impression Statement  Continued with established plan of care this session. Patient reported stiffness, but no pain. Continued focus on improving ROM to patient's tolerance. Performed manual therapy to decrease edema and patellofemoral joint mobs to decrease pain. Patient very fatigued with SLR exercises this session and continued to require cueing to perform quad set prior to lifting LE. Discussed with patient listening to his body and not rushing back to work. Patient reported he is currently set to return to work on 01/06/19. Plan to continue following up about this.    Personal Factors and Comorbidities  Age;Comorbidity 1;Fitness;Time since onset of injury/illness/exacerbation    Comorbidities  see above    Examination-Activity Limitations  Squat;Stairs;Locomotion Level;Stand;Transfers    Examination-Participation Restrictions  Community Activity    Stability/Clinical Decision Making  Stable/Uncomplicated    Rehab Potential  Good    PT Frequency  3x / week   Possible to reduce to 2x/week PRN   PT Duration  6 weeks    PT Treatment/Interventions  ADLs/Self Care Home Management;Aquatic Therapy;Biofeedback;Cryotherapy;Electrical Stimulation;Iontophoresis 4mg /ml Dexamethasone;Moist Heat;Ultrasound;Gait training;Stair training;Functional mobility training;Therapeutic activities;Therapeutic exercise;Balance training;Neuromuscular re-education;Patient/family education;Orthotic Fit/Training;Manual techniques;Passive range of motion;Dry  needling;Taping;Joint Manipulations;DME Instruction;Scar mobilization;Energy conservation    PT Next Visit Plan  Next session add rockerboard, continue quad extension exercises.  Focus on ROM, edema reduction then gait, strengthening and balance.    PT Home Exercise Plan  12/13/18: Ankle pumps, heel slides, quad sets; 10/01: hamstring and gastroc stretches, SLR; 12/24/18: LAQ and TKE wiht ball or towel against wall.       Patient will benefit from skilled therapeutic intervention in order to improve the following deficits and impairments:  Decreased activity tolerance, Decreased range of motion, Decreased strength, Improper body mechanics, Pain, Abnormal gait, Decreased scar mobility, Decreased endurance, Hypomobility, Decreased balance, Difficulty walking, Increased edema, Impaired flexibility  Visit Diagnosis: Stiffness of right knee, not elsewhere classified  Localized edema  Muscle weakness (generalized)  Other abnormalities of gait and mobility     Problem List Patient Active Problem List   Diagnosis Date Noted  . Patellar subluxation, right, sequela 12/12/2018  . 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 Localized superficial swelling, mass, or lump   . ANKLE PAIN 07/24/2007   Verne Carrow PT, DPT 2:42 PM, 12/27/18 [phone]  [facility] [address] [place], [place], [address] Phone: [phone]   Fax:  [phone]  Name: Victor Ashley MRN: [id number] Date of Birth: 1995/09/05

## 2018-12-31 ENCOUNTER — Other Ambulatory Visit: Payer: Self-pay

## 2018-12-31 ENCOUNTER — Encounter (HOSPITAL_COMMUNITY): Payer: Self-pay | Admitting: Physical Therapy

## 2018-12-31 ENCOUNTER — Ambulatory Visit (HOSPITAL_COMMUNITY): Payer: BC Managed Care – PPO | Admitting: Physical Therapy

## 2018-12-31 DIAGNOSIS — M25661 Stiffness of right knee, not elsewhere classified: Secondary | ICD-10-CM | POA: Diagnosis not present

## 2018-12-31 DIAGNOSIS — M6281 Muscle weakness (generalized): Secondary | ICD-10-CM

## 2018-12-31 DIAGNOSIS — R600 Localized edema: Secondary | ICD-10-CM

## 2018-12-31 DIAGNOSIS — R2689 Other abnormalities of gait and mobility: Secondary | ICD-10-CM

## 2018-12-31 NOTE — Therapy (Signed)
[place], [place], [address] Phone: [phone]   Fax:  [phone]  Physical Therapy Treatment  Patient Details  Name: Victor Ashley MRN: [id number] Date of Birth: 07/01/1995 Referring Provider (PT): Marybelle Killings, MD   Encounter Date: 12/31/2018  PT End of Session - 12/31/18 1533    Visit Number  6    Number of Visits  18    Date for PT Re-Evaluation  01/24/19   Mini re-assess 01/03/19   Authorization Type  BCBS (No visit limit)    Authorization Time Period  12/13/18-01/24/19    PT Start Time  1432    PT Stop Time  1516    PT Time Calculation (min)  44 min    Activity Tolerance  Patient tolerated treatment well    Behavior During Therapy  [facility] for tasks assessed/performed       History reviewed. No pertinent past medical history.  Past Surgical History:  Procedure Laterality Date  . CYST EXCISION Left 07/29/2015   Procedure: EXCISION OF MASS, LEFT PALM;  Surgeon: Carole Civil, MD;  Location: [facility];  Service: Orthopedics;  Laterality: Left;    There were no vitals filed for this visit.  Subjective Assessment - 12/31/18 1532    Subjective  Patients currently with no pain, no new issues since last visit. Has been weaning out of immobilizer brace with no issues, still wears it when he goes out.    Patient Stated Goals  Walking normal    Currently in Pain?  No/denies         [facility] PT Assessment - 12/31/18 0001      AROM   Right Knee Extension  5    Right Knee Flexion  135                   [facility] Adult PT Treatment/Exercise - 12/31/18 0001      Knee/Hip Exercises: Stretches   Active Hamstring Stretch  Right;3 reps;30 seconds   at 12 inch step   Hip Flexor Stretch  Right;3 reps;30 seconds   Modified thomas stretch    Gastroc Stretch  3 reps;30 seconds    Gastroc Stretch Limitations  slant board      Knee/Hip Exercises: Standing   Heel Raises  Both;20 reps    Heel Raises  Limitations  incline slope    Lateral Step Up  Right;2 sets;10 reps;Step Height: 4"    Forward Step Up  Right;2 sets;10 reps;Step Height: 4"    Functional Squat  2 sets;10 reps   mini squats     Knee/Hip Exercises: Supine   Quad Sets  Right;20 reps   5 second hold with towel roll under knee   Straight Leg Raises  2 sets;Right;10 reps    Straight Leg Raises Limitations  cueing for quad set prior SLR      Manual Therapy   Manual Therapy  Soft tissue mobilization;Joint mobilization    Manual therapy comments  Manual complete separate than rest of tx    Joint Mobilization  Patellofemoral joint mobilizations grade II-III all directions, restricted medial to lateral    Soft tissue mobilization  STM to RT distal quad             PT Education - 12/31/18 1532    Education Details  Patient educated on addition of heel prop to HEP    Person(s) Educated  Patient    Methods  Explanation  Comprehension  Verbalized understanding;Returned demonstration       PT Short Term Goals - 12/13/18 1618      PT SHORT TERM GOAL #1   Title  Patient will report understanding and regular compliance with HEP to improve ROM, decrease edema, and improve overall functional mobility.    Time  3    Period  Weeks    Status  New    Target Date  01/03/19      PT SHORT TERM GOAL #2   Title  Patient will demonstrate right knee AROM 5-90 degrees to improve gait mechanics and overall functional mobility.    Time  3    Period  Weeks    Status  New    Target Date  01/03/19        PT Long Term Goals - 12/13/18 1628      PT LONG TERM GOAL #1   Title  Patient will demonstrate right knee AROM 0-125 degrees to improve gait mechanics and overall functional mobility.    Time  6    Period  Weeks    Status  New    Target Date  01/24/19      PT LONG TERM GOAL #2   Title  Patient will demonstrate improvement in MMT strength by 1 grade to improve ability to perform transfers and overall functional mobility  to return to work.    Time  6    Period  Weeks    Status  New    Target Date  01/24/19      PT LONG TERM GOAL #3   Title  Patient will demonstrate ability to ambulate at a gait speed of at least 1.2 m/s on the with minimal to no gait deviations in order to ambulate more easily in the community safely.    Time  6    Period  Weeks    Status  New    Target Date  01/24/19      PT LONG TERM GOAL #4   Title  Patient will demonstrate ability to perform SLS for at least 1 minute with minimal to no sway and no notable knee flexion.    Time  6    Period  Weeks    Status  New    Target Date  01/24/19            Plan - 12/31/18 1534    Clinical Impression Statement  Patient tolerated session well today. Patient still limited in full RT knee extension, but has good flexion. Patient educated on adding heel prop to HEP for improving knee extension. Patient shows good quad contraction, but still with mild quad lag during SLR. Added box step up forward and lateral this date. Patient able to perform with no pain, but occasional "catching" about patella. Patient required verbal cues for avoiding RT knee valgus during mini squats, and for keeping 50/50 weight bearing between RT and LT LE.    Personal Factors and Comorbidities  Age;Comorbidity 1;Fitness;Time since onset of injury/illness/exacerbation    Comorbidities  see above    Examination-Activity Limitations  Squat;Stairs;Locomotion Level;Stand;Transfers    Examination-Participation Restrictions  Community Activity    Stability/Clinical Decision Making  Stable/Uncomplicated    Rehab Potential  Good    PT Frequency  3x / week   Possible to reduce to 2x/week PRN   PT Duration  6 weeks    PT Treatment/Interventions  ADLs/Self Care Home Management;Aquatic Therapy;Biofeedback;Cryotherapy;Electrical Stimulation;Iontophoresis 4mg /ml Dexamethasone;Moist Heat;Ultrasound;Gait training;Stair training;Functional mobility  training;Therapeutic  activities;Therapeutic exercise;Balance training;Neuromuscular re-education;Patient/family education;Orthotic Fit/Training;Manual techniques;Passive range of motion;Dry needling;Taping;Joint Manipulations;DME Instruction;Scar mobilization;Energy conservation    PT Next Visit Plan  Reassessment next session. Continue to progress quad strengthening as able. Add SLS, eccentric step lowering next visit. Continue to progress to full knee extension with manual treatment.    PT Home Exercise Plan  12/13/18: Ankle pumps, heel slides, quad sets; 10/01: hamstring and gastroc stretches, SLR; 12/24/18: LAQ and TKE wiht ball or towel against wall.; 12/31/18- heel prop       Patient will benefit from skilled therapeutic intervention in order to improve the following deficits and impairments:  Decreased activity tolerance, Decreased range of motion, Decreased strength, Improper body mechanics, Pain, Abnormal gait, Decreased scar mobility, Decreased endurance, Hypomobility, Decreased balance, Difficulty walking, Increased edema, Impaired flexibility  Visit Diagnosis: Stiffness of right knee, not elsewhere classified  Localized edema  Muscle weakness (generalized)  Other abnormalities of gait and mobility     Problem List Patient Active Problem List   Diagnosis Date Noted  . Patellar subluxation, right, sequela 12/12/2018  . 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 Localized superficial swelling, mass, or lump   . ANKLE PAIN 07/24/2007    Ronita Hippsameron A Vernesha Talbot PT DPT 12/31/2018, 3:45 PM  [facility] [address] [place], [place], [address] Phone: [phone]   Fax:  [phone]  Name: Viviana SimplerMatthew Breden MRN: [id number] Date of Birth: 05/27/1995

## 2019-01-02 ENCOUNTER — Ambulatory Visit (HOSPITAL_COMMUNITY): Payer: BC Managed Care – PPO | Admitting: Physical Therapy

## 2019-01-02 ENCOUNTER — Encounter (HOSPITAL_COMMUNITY): Payer: Self-pay | Admitting: Physical Therapy

## 2019-01-02 ENCOUNTER — Other Ambulatory Visit: Payer: Self-pay

## 2019-01-02 DIAGNOSIS — M25661 Stiffness of right knee, not elsewhere classified: Secondary | ICD-10-CM

## 2019-01-02 DIAGNOSIS — R2689 Other abnormalities of gait and mobility: Secondary | ICD-10-CM

## 2019-01-02 DIAGNOSIS — R600 Localized edema: Secondary | ICD-10-CM

## 2019-01-02 DIAGNOSIS — M6281 Muscle weakness (generalized): Secondary | ICD-10-CM

## 2019-01-02 NOTE — Therapy (Addendum)
[place], [place], [address] Phone: [phone]   Fax:  [phone]  Physical Therapy Treatment / Progress Note  Patient Details  Name: Antwann Preziosi MRN: [id number] Date of Birth: 30-Jul-1995 Referring Provider (PT): Marybelle Killings, MD   Encounter Date: 01/02/2019   Progress Note Reporting Period 12/13/18 to 01/02/19  See note below for Objective Data and Assessment of Progress/Goals.       PT End of Session - 01/02/19 1446    Visit Number  7    Number of Visits  18    Date for PT Re-Evaluation  01/24/19   Mini re-assess completed 01/02/19   Authorization Type  BCBS (No visit limit)    Authorization Time Period  12/13/18-01/24/19    PT Start Time  1347    PT Stop Time  1432    PT Time Calculation (min)  45 min    Activity Tolerance  Patient tolerated treatment well    Behavior During Therapy  [facility] for tasks assessed/performed       History reviewed. No pertinent past medical history.  Past Surgical History:  Procedure Laterality Date  . CYST EXCISION Left 07/29/2015   Procedure: EXCISION OF MASS, LEFT PALM;  Surgeon: Carole Civil, MD;  Location: [facility];  Service: Orthopedics;  Laterality: Left;    There were no vitals filed for this visit.  Subjective Assessment - 01/02/19 1439    Subjective  Patient says knee is doing well. He says his knee was a little stiff this PM, but not really "hurting". Went for 1 mile walk this AM, no pain but noted catching. Patient added heel prop to HEP with no issues.    How long can you sit comfortably?  no issues    How long can you stand comfortably?  > 30 min    How long can you walk comfortably?  1 mile    Patient Stated Goals  Walking normal    Currently in Pain?  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    Referring Provider (PT)  Marybelle Killings,  MD    Onset Date/Surgical Date  03/20/18   beginning of 2020   Next MD Visit  11/19      Restrictions   Weight Bearing Restrictions  No      Observation/Other Assessments   Observations  Incisions intact and appear to be well healed, minimal edema    Focus on Therapeutic Outcomes (FOTO)   36% limited      AROM   Right Knee Extension  4    Right Knee Flexion  135      Strength   Right Hip Flexion  5/5    Right Hip Extension  4+/5    Right Hip ABduction  4+/5    Left Hip Flexion  5/5    Left Hip Extension  5/5    Left Hip ABduction  5/5    Right Knee Flexion  4+/5    Right Knee Extension  4+/5    Left Knee Flexion  5/5    Left Knee Extension  5/5    Right Ankle Dorsiflexion  5/5    Left Ankle Dorsiflexion  5/5      Palpation   Patella mobility  good mobility bil    Palpation comment  No noted tenderness to palpation       Ambulation/Gait   Ambulation/Gait  Yes    Ambulation/Gait Assistance  7: Independent    Ambulation Distance (Feet)  452 Feet    Assistive device  None    Gait Pattern  Decreased step length - right;Right flexed knee in stance    Ambulation Surface  Level    Gait velocity  1.14 m/s    Gait Comments  2MWT                   [facility] Adult PT Treatment/Exercise - 01/02/19 0001      Knee/Hip Exercises: Stretches   Active Hamstring Stretch  Right;3 reps;30 seconds    Gastroc Stretch  3 reps;30 seconds    Gastroc Stretch Limitations  slant board      Knee/Hip Exercises: Standing   Lateral Step Up  Right;2 sets;10 reps;Step Height: 4"    Forward Step Up  Right;2 sets;10 reps;Step Height: 4"    Step Down  Right;2 sets;10 reps;Step Height: 4"   FWD/ lateral; RLE stance   SLS  1 x 60" each; BLE; level surface      Manual Therapy   Manual Therapy  Soft tissue mobilization;Joint mobilization    Manual therapy comments  Manual complete separate than rest of tx    Joint Mobilization  Patellofemoral joint mobilizations grade II-III all  directions, restricted medial to lateral    Soft tissue mobilization  STM to RT distal quad             PT Education - 01/02/19 1444    Education Details  Patient educated on reassessment findings and POC    Person(s) Educated  Patient    Methods  Explanation    Comprehension  Verbalized understanding       PT Short Term Goals - 01/02/19 1451      PT SHORT TERM GOAL #1   Title  Patient will report understanding and regular compliance with HEP to improve ROM, decrease edema, and improve overall functional mobility.    Time  3    Period  Weeks    Status  Achieved    Target Date  01/03/19      PT SHORT TERM GOAL #2   Title  Patient will demonstrate right knee AROM 5-90 degrees to improve gait mechanics and overall functional mobility.    Time  3    Period  Weeks    Status  Achieved    Target Date  01/03/19        PT Long Term Goals - 01/02/19 1452      PT LONG TERM GOAL #1   Title  Patient will demonstrate right knee AROM 0-125 degrees to improve gait mechanics and overall functional mobility.    Baseline  4-135    Time  6    Period  Weeks    Status  On-going      PT LONG TERM GOAL #2   Title  Patient will demonstrate improvement in MMT strength by 1 grade to improve ability to perform transfers and overall functional mobility to return to work.    Time  6    Period  Weeks    Status  Achieved      PT LONG TERM GOAL #3   Title  Patient will demonstrate ability to ambulate at a gait speed of at least 1.2 m/s on the 2MWT with minimal to no gait deviations in order to ambulate more  easily in the community safely.    Baseline  1.14 m/s but with decreased stance on RT LE    Time  6    Period  Weeks    Status  Partially Met      PT LONG TERM GOAL #4   Title  Patient will demonstrate ability to perform SLS for at least 1 minute with minimal to no sway and no notable knee flexion.    Baseline  Able to satnd > 1 minute but in slight flexed position    Time  6     Period  Weeks    Status  Partially Met            Plan - 01/02/19 1448    Clinical Impression Statement  Patient is making good porgress to LTGs. Patient shows good strength in RLE, but still with slight RT knee extension restriciton, which continues to affect gait. Pt also still shows some instability at RT knee with squatting and steeping exercise as noted by valgus angle and sensation of "catching" often reported. Patient will continue to benefit from skilled physical therapy to address these remaining deficits to return to prior level of funciton with ADLs and work tasks.    Personal Factors and Comorbidities  Age;Comorbidity 1;Fitness;Time since onset of injury/illness/exacerbation    Comorbidities  see above    Examination-Activity Limitations  Squat;Stairs;Locomotion Level;Stand;Transfers    Examination-Participation Restrictions  Community Activity    Stability/Clinical Decision Making  Stable/Uncomplicated    Rehab Potential  Good    PT Frequency  2x / week   Possible to reduce to 2x/week PRN   PT Duration  3 weeks    PT Treatment/Interventions  ADLs/Self Care Home Management;Aquatic Therapy;Biofeedback;Cryotherapy;Electrical Stimulation;Iontophoresis 12m/ml Dexamethasone;Moist Heat;Ultrasound;Gait training;Stair training;Functional mobility training;Therapeutic activities;Therapeutic exercise;Balance training;Neuromuscular re-education;Patient/family education;Orthotic Fit/Training;Manual techniques;Passive range of motion;Dry needling;Taping;Joint Manipulations;DME Instruction;Scar mobilization;Energy conservation    PT Next Visit Plan  Continue to progress propriocetion and quad strength. Continue to improve RT knee extension AROM.    PT Home Exercise Plan  12/13/18: Ankle pumps, heel slides, quad sets; 10/01: hamstring and gastroc stretches, SLR; 12/24/18: LAQ and TKE wiht ball or towel against wall.; 12/31/18- heel prop       Patient will benefit from skilled therapeutic  intervention in order to improve the following deficits and impairments:  Decreased activity tolerance, Decreased range of motion, Decreased strength, Improper body mechanics, Pain, Abnormal gait, Decreased scar mobility, Decreased endurance, Hypomobility, Decreased balance, Difficulty walking, Increased edema, Impaired flexibility  Visit Diagnosis: Stiffness of right knee, not elsewhere classified  Localized edema  Muscle weakness (generalized)  Other abnormalities of gait and mobility     Problem List Patient Active Problem List   Diagnosis Date Noted  . Patellar subluxation, right, sequela 12/12/2018  . 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 Localized superficial swelling, mass, or lump   . ANKLE PAIN 07/24/2007    CElizbeth SquiresPT DPT 01/02/2019, 3:07 PM  [place]SE[place] [address]Phone: [phone]  Fax:  [phone] Name: MArnett DuddyMRN: [id number] of Birth: 710-13-1997

## 2019-01-07 ENCOUNTER — Ambulatory Visit (HOSPITAL_COMMUNITY): Payer: BC Managed Care – PPO | Admitting: Physical Therapy

## 2019-01-07 ENCOUNTER — Other Ambulatory Visit: Payer: Self-pay

## 2019-01-07 ENCOUNTER — Encounter (HOSPITAL_COMMUNITY): Payer: BC Managed Care – PPO

## 2019-01-07 ENCOUNTER — Encounter (HOSPITAL_COMMUNITY): Payer: Self-pay | Admitting: Physical Therapy

## 2019-01-07 DIAGNOSIS — M25661 Stiffness of right knee, not elsewhere classified: Secondary | ICD-10-CM | POA: Diagnosis not present

## 2019-01-07 DIAGNOSIS — M6281 Muscle weakness (generalized): Secondary | ICD-10-CM

## 2019-01-07 DIAGNOSIS — R2689 Other abnormalities of gait and mobility: Secondary | ICD-10-CM

## 2019-01-07 DIAGNOSIS — R600 Localized edema: Secondary | ICD-10-CM

## 2019-01-07 NOTE — Therapy (Signed)
[place], [place], [address] Phone: [phone]   Fax:  [phone]  Physical Therapy Treatment  Patient Details  Name: Victor Ashley MRN: [id number] Date of Birth: 1995/10/22 Referring Provider (PT): Marybelle Killings, MD   Encounter Date: 01/07/2019  PT End of Session - 01/07/19 1709    Visit Number  8    Number of Visits  18    Date for PT Re-Evaluation  01/24/19   Mini re-assess completed 01/02/19   Authorization Type  BCBS (No visit limit)    Authorization Time Period  12/13/18-01/24/19    PT Start Time  1620    PT Stop Time  1705    PT Time Calculation (min)  45 min    Activity Tolerance  Patient tolerated treatment well    Behavior During Therapy  [facility] for tasks assessed/performed       History reviewed. No pertinent past medical history.  Past Surgical History:  Procedure Laterality Date  . CYST EXCISION Left 07/29/2015   Procedure: EXCISION OF MASS, LEFT PALM;  Surgeon: Carole Civil, MD;  Location: [facility];  Service: Orthopedics;  Laterality: Left;    There were no vitals filed for this visit.  Subjective Assessment - 01/07/19 1707    Subjective  Patient reports he went back to work today and worked 8 hour shift. Patient states he did a fair amount of walking with boots on, and that his knee is a little swollen but reports no pain currently.    How long can you sit comfortably?  no issues    How long can you stand comfortably?  > 30 min    How long can you walk comfortably?  1 mile    Patient Stated Goals  Walking normal    Currently in Pain?  No/denies                       [facility] Adult PT Treatment/Exercise - 01/07/19 0001      Knee/Hip Exercises: Stretches   Active Hamstring Stretch  Right;3 reps;30 seconds    Gastroc Stretch  3 reps;30 seconds    Gastroc Stretch Limitations  slant board      Knee/Hip Exercises: Machines for Strengthening   Cybex Leg Press  2 x 10, 5  plates, BLE      Knee/Hip Exercises: Standing   Lateral Step Up  Right;2 sets;10 reps;Step Height: 4"   eccentric lowering   Step Down  Right;2 sets;10 reps;Step Height: 6"    SLS  3 x 20" on foam pad BLE    Other Standing Knee Exercises  Sidestepping with green band, 15 feet x2 RT      Knee/Hip Exercises: Supine   Quad Sets  Right;20 reps    Bridges  2 sets;10 reps    Straight Leg Raises  2 sets;Right;10 reps    Straight Leg Raises Limitations  cueing for quad set prior SLR      Victor Therapy   Victor Therapy  Soft tissue mobilization;Joint mobilization    Victor therapy comments  Victor complete separate than rest of tx    Joint Mobilization  Patellofemoral joint mobilizations grade II-III all directions, restricted medial to lateral    Soft tissue mobilization  STM to RT distal quad             PT Education - 01/07/19 1708    Education Details  Patient educated on addition of gastroc  wall stretching to HEP to improve RT ankle mobility    Person(s) Educated  Patient    Methods  Explanation    Comprehension  Verbalized understanding       PT Short Term Goals - 01/02/19 1451      PT SHORT TERM GOAL #1   Title  Patient will report understanding and regular compliance with HEP to improve ROM, decrease edema, and improve overall functional mobility.    Time  3    Period  Weeks    Status  Achieved    Target Date  01/03/19      PT SHORT TERM GOAL #2   Title  Patient will demonstrate right knee AROM 5-90 degrees to improve gait mechanics and overall functional mobility.    Time  3    Period  Weeks    Status  Achieved    Target Date  01/03/19        PT Long Term Goals - 01/02/19 1452      PT LONG TERM GOAL #1   Title  Patient will demonstrate right knee AROM 0-125 degrees to improve gait mechanics and overall functional mobility.    Baseline  4-135    Time  6    Period  Weeks    Status  On-going      PT LONG TERM GOAL #2   Title  Patient will demonstrate  improvement in MMT strength by 1 grade to improve ability to perform transfers and overall functional mobility to return to work.    Time  6    Period  Weeks    Status  Achieved      PT LONG TERM GOAL #3   Title  Patient will demonstrate ability to ambulate at a gait speed of at least 1.2 m/s on the 2MWT with minimal to no gait deviations in order to ambulate more easily in the community safely.    Baseline  1.14 m/s but with decreased stance on RT LE    Time  6    Period  Weeks    Status  Partially Met      PT LONG TERM GOAL #4   Title  Patient will demonstrate ability to perform SLS for at least 1 minute with minimal to no sway and no notable knee flexion.    Baseline  Able to satnd > 1 minute but in slight flexed position    Time  6    Period  Weeks    Status  Partially Met            Plan - 01/07/19 1709    Clinical Impression Statement  Patient tolerated session well today with no increased complaint of pain. Patient did well with SLS on foam pad, but continues to have difficulty avoiding RT knee valgus with squatting/ single leg stepping activity. Patient is able to correct to some degree with verbal cueing. Limited RT ankle dorsiflexion appears to be contributing also to this issue, so patient was advised to increase frequency of gastroc stretching in HEP. Added machine leg press, and banded sidestepping today. Patient tolerated added activity well.    Personal Factors and Comorbidities  Age;Comorbidity 1;Fitness;Time since onset of injury/illness/exacerbation    Comorbidities  see above    Examination-Activity Limitations  Squat;Stairs;Locomotion Level;Stand;Transfers    Examination-Participation Restrictions  Community Activity    Stability/Clinical Decision Making  Stable/Uncomplicated    Rehab Potential  Good    PT Frequency  2x / week   Possible  to reduce to 2x/week PRN   PT Duration  3 weeks    PT Treatment/Interventions  ADLs/Self Care Home Management;Aquatic  Therapy;Biofeedback;Cryotherapy;Electrical Stimulation;Iontophoresis 80m/ml Dexamethasone;Moist Heat;Ultrasound;Gait training;Stair training;Functional mobility training;Therapeutic activities;Therapeutic exercise;Balance training;Neuromuscular re-education;Patient/family education;Orthotic Fit/Training;Victor techniques;Passive range of motion;Dry needling;Taping;Joint Manipulations;DME Instruction;Scar mobilization;Energy conservation    PT Next Visit Plan  Continue cueing for proper knee and foot alignment with squatting and single leg stance exercise. Add banded monsterwalks next visit    PT Home Exercise Plan  12/13/18: Ankle pumps, heel slides, quad sets; 10/01: hamstring and gastroc stretches, SLR; 12/24/18: LAQ and TKE wiht ball or towel against wall.; 12/31/18- heel prop; 01/07/19- gastroc stretch at wall       Patient will benefit from skilled therapeutic intervention in order to improve the following deficits and impairments:  Decreased activity tolerance, Decreased range of motion, Decreased strength, Improper body mechanics, Pain, Abnormal gait, Decreased scar mobility, Decreased endurance, Hypomobility, Decreased balance, Difficulty walking, Increased edema, Impaired flexibility  Visit Diagnosis: Stiffness of right knee, not elsewhere classified  Localized edema  Muscle weakness (generalized)  Other abnormalities of gait and mobility     Problem List Patient Active Problem List   Diagnosis Date Noted  . Patellar subluxation, right, sequela 12/12/2018  . 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 Localized superficial swelling, mass, or lump   . ANKLE PAIN 07/24/2007    CElizbeth SquiresPT DPT 01/07/2019, 5:20 PM  [place] [address]Phone: [phone]  Fax:  [phone] Name: MNahuel WilbertMRN: [id number] of Birth: [birth year]

## 2019-01-08 ENCOUNTER — Ambulatory Visit (HOSPITAL_COMMUNITY): Payer: BC Managed Care – PPO

## 2019-01-09 ENCOUNTER — Other Ambulatory Visit: Payer: Self-pay

## 2019-01-09 ENCOUNTER — Encounter (HOSPITAL_COMMUNITY): Payer: Self-pay | Admitting: Physical Therapy

## 2019-01-09 ENCOUNTER — Ambulatory Visit (HOSPITAL_COMMUNITY): Payer: BC Managed Care – PPO | Admitting: Physical Therapy

## 2019-01-09 DIAGNOSIS — M6281 Muscle weakness (generalized): Secondary | ICD-10-CM

## 2019-01-09 DIAGNOSIS — R600 Localized edema: Secondary | ICD-10-CM

## 2019-01-09 DIAGNOSIS — M25661 Stiffness of right knee, not elsewhere classified: Secondary | ICD-10-CM

## 2019-01-09 DIAGNOSIS — R2689 Other abnormalities of gait and mobility: Secondary | ICD-10-CM

## 2019-01-09 NOTE — Therapy (Signed)
[place], [place], [address] Phone: [phone]   Fax:  [phone]  Physical Therapy Treatment  Patient Details  Name: Victor Ashley MRN: [id number] Date of Birth: Feb 07, 1996 Referring Provider (PT): Marybelle Killings, MD   Encounter Date: 01/09/2019  PT End of Session - 01/09/19 1739    Visit Number  9    Number of Visits  18    Date for PT Re-Evaluation  01/24/19   Mini re-assess completed 01/02/19   Authorization Type  BC[employer] (No visit limit)    Authorization Time Period  12/13/18-01/24/19    PT Start Time  1615    PT Stop Time  1700    PT Time Calculation (min)  45 min    Activity Tolerance  Patient tolerated treatment well    Behavior During Therapy  [facility] for tasks assessed/performed       History reviewed. No pertinent past medical history.  Past Surgical History:  Procedure Laterality Date  . CYST EXCISION Left 07/29/2015   Procedure: EXCISION OF MASS, LEFT PALM;  Surgeon: Carole Civil, MD;  Location: [facility];  Service: Orthopedics;  Laterality: Left;    There were no vitals filed for this visit.                    [facility] Adult PT Treatment/Exercise - 01/09/19 0001      Knee/Hip Exercises: Stretches   Active Hamstring Stretch  Right;3 reps;30 seconds    Active Hamstring Stretch Limitations  standing on 12" step    Gastroc Stretch  3 reps;30 seconds    Gastroc Stretch Limitations  slant board      Knee/Hip Exercises: Machines for Strengthening   Cybex Leg Press  2 x 10, 5 plates, BLE; 2 sets 10 reps Rt only 3PL      Knee/Hip Exercises: Standing   Heel Raises  Both;20 reps    Heel Raises Limitations  incline slope    Lateral Step Up  Right;15 reps;Hand Hold: 0;Step Height: 6"    Lateral Step Up Limitations  eccentric control, heel down    Forward Step Up  Right;15 reps;Step Height: 6";Hand Hold: 0    Step Down  Right;15 reps;Hand Hold: 2;Step Height: 6"    Step Down Limitations   eccentric control, heel down    SLS with Vectors  5X10" each way    Gait Training  heel to toe gait    Other Standing Knee Exercises  BAPS level 3 Rt only 10 reps each way      Manual Therapy   Manual Therapy  Soft tissue mobilization;Myofascial release    Manual therapy comments  Manual complete separate than rest of tx    Soft tissue mobilization  STM to RT distal quad    Myofascial Release  medial hamstring in prone               PT Short Term Goals - 01/02/19 1451      PT SHORT TERM GOAL #1   Title  Patient will report understanding and regular compliance with HEP to improve ROM, decrease edema, and improve overall functional mobility.    Time  3    Period  Weeks    Status  Achieved    Target Date  01/03/19      PT SHORT TERM GOAL #2   Title  Patient will demonstrate right knee AROM 5-90 degrees to improve gait mechanics and overall functional mobility.  Time  3    Period  Weeks    Status  Achieved    Target Date  01/03/19        PT Long Term Goals - 01/02/19 1452      PT LONG TERM GOAL #1   Title  Patient will demonstrate right knee AROM 0-125 degrees to improve gait mechanics and overall functional mobility.    Baseline  4-135    Time  6    Period  Weeks    Status  On-going      PT LONG TERM GOAL #2   Title  Patient will demonstrate improvement in MMT strength by 1 grade to improve ability to perform transfers and overall functional mobility to return to work.    Time  6    Period  Weeks    Status  Achieved      PT LONG TERM GOAL #3   Title  Patient will demonstrate ability to ambulate at a gait speed of at least 1.2 m/s on the 2MWT with minimal to no gait deviations in order to ambulate more easily in the community safely.    Baseline  1.14 m/s but with decreased stance on RT LE    Time  6    Period  Weeks    Status  Partially Met      PT LONG TERM GOAL #4   Title  Patient will demonstrate ability to perform SLS for at least 1 minute with  minimal to no sway and no notable knee flexion.    Baseline  Able to satnd > 1 minute but in slight flexed position    Time  6    Period  Weeks    Status  Partially Met            Plan - 01/09/19 1740    Clinical Impression Statement  continued with LE strengthening, increasing step height and focus on eccentric control and patellar tracking.  Noted weakness of Rt quad with activity and overall weakness/stiffness of ankle.  Added BAPS to work on Rt ankle to increase kinetic chain.  Pt able to complete with cues for form and posturing.  Encouraged to continue focus on heel to toe gait as tends to walk on his toes.  Manual completed to medial hamstring in prone with overall improvement voiced.    Personal Factors and Comorbidities  Age;Comorbidity 1;Fitness;Time since onset of injury/illness/exacerbation    Comorbidities  see above    Examination-Activity Limitations  Squat;Stairs;Locomotion Level;Stand;Transfers    Examination-Participation Restrictions  Community Activity    Stability/Clinical Decision Making  Stable/Uncomplicated    Rehab Potential  Good    PT Frequency  2x / week   Possible to reduce to 2x/week PRN   PT Duration  3 weeks    PT Treatment/Interventions  ADLs/Self Care Home Management;Aquatic Therapy;Biofeedback;Cryotherapy;Electrical Stimulation;Iontophoresis 80m/ml Dexamethasone;Moist Heat;Ultrasound;Gait training;Stair training;Functional mobility training;Therapeutic activities;Therapeutic exercise;Balance training;Neuromuscular re-education;Patient/family education;Orthotic Fit/Training;Manual techniques;Passive range of motion;Dry needling;Taping;Joint Manipulations;DME Instruction;Scar mobilization;Energy conservation    PT Next Visit Plan  Continue cueing for proper knee and foot alignment with squatting and single leg stance exercise. Add banded monsterwalks next visit    PT Home Exercise Plan  12/13/18: Ankle pumps, heel slides, quad sets; 10/01: hamstring and  gastroc stretches, SLR; 12/24/18: LAQ and TKE wiht ball or towel against wall.; 12/31/18- heel prop; 01/07/19- gastroc stretch at wall       Patient will benefit from skilled therapeutic intervention in order to improve the following deficits and  impairments:  Decreased activity tolerance, Decreased range of motion, Decreased strength, Improper body mechanics, Pain, Abnormal gait, Decreased scar mobility, Decreased endurance, Hypomobility, Decreased balance, Difficulty walking, Increased edema, Impaired flexibility  Visit Diagnosis: Localized edema  Muscle weakness (generalized)  Other abnormalities of gait and mobility  Stiffness of right knee, not elsewhere classified     Problem List Patient Active Problem List   Diagnosis Date Noted  . Patellar subluxation, right, sequela 12/12/2018  . 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 Localized superficial swelling, mass, or lump   . ANKLE PAIN 07/24/2007   Teena Irani, PTA/CLT [phone]  Teena Irani 01/09/2019, 5:42 PM  [facility] [address] [place], [place], [address] Phone: [phone]   Fax:  [phone]  Name: Shalon Councilman MRN: [id number] Date of Birth: 01-11-1996

## 2019-01-14 ENCOUNTER — Encounter (HOSPITAL_COMMUNITY): Payer: Self-pay

## 2019-01-14 ENCOUNTER — Ambulatory Visit (HOSPITAL_COMMUNITY): Payer: BC Managed Care – PPO

## 2019-01-14 ENCOUNTER — Other Ambulatory Visit: Payer: Self-pay

## 2019-01-14 DIAGNOSIS — M6281 Muscle weakness (generalized): Secondary | ICD-10-CM

## 2019-01-14 DIAGNOSIS — R2689 Other abnormalities of gait and mobility: Secondary | ICD-10-CM

## 2019-01-14 DIAGNOSIS — R600 Localized edema: Secondary | ICD-10-CM

## 2019-01-14 DIAGNOSIS — M25661 Stiffness of right knee, not elsewhere classified: Secondary | ICD-10-CM

## 2019-01-14 NOTE — Therapy (Signed)
[place], [place], [address] Phone: [phone]   Fax:  [phone]  Physical Therapy Treatment  Patient Details  Name: Victor Ashley MRN: [id number] Date of Birth: 15-Dec-1995 Referring Provider (PT): Marybelle Killings, MD   Encounter Date: 01/14/2019  PT End of Session - 01/14/19 1456    Visit Number  10    Number of Visits  18    Date for PT Re-Evaluation  01/24/19   Progress note complete 01/02/19, visit #7   Authorization Type  BCBS (No visit limit)    Authorization Time Period  12/13/18-01/24/19    PT Start Time  1450    PT Stop Time  1530    PT Time Calculation (min)  40 min    Activity Tolerance  Patient tolerated treatment well    Behavior During Therapy  [facility] for tasks assessed/performed       History reviewed. No pertinent past medical history.  Past Surgical History:  Procedure Laterality Date  . CYST EXCISION Left 07/29/2015   Procedure: EXCISION OF MASS, LEFT PALM;  Surgeon: Carole Civil, MD;  Location: [facility];  Service: Orthopedics;  Laterality: Left;    There were no vitals filed for this visit.  Subjective Assessment - 01/14/19 1452    Subjective  Pt reports he is feeling good today, no reports of pain.  Has RTW last week, 8 hr shifts and has been wearing the compression hose to assist with swelling.  Reports the knee cap catches when walking about every 15 steps.    Patient Stated Goals  Walking normal    Currently in Pain?  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    Referring Provider (PT)  Marybelle Killings, MD    Onset Date/Surgical Date  03/20/18   beginning of 2020   Next MD Visit  11/19    Prior Therapy  Before surgery      Precautions   Precautions  None                   [facility] Adult PT Treatment/Exercise - 01/14/19 0001      Knee/Hip Exercises:  Stretches   Active Hamstring Stretch  Right;3 reps;30 seconds    Active Hamstring Stretch Limitations  standing on 12" step    Gastroc Stretch  3 reps;30 seconds    Gastroc Stretch Limitations  slant board      Knee/Hip Exercises: Machines for Strengthening   Cybex Leg Press  2 x 10, 5 plates, BLE; 2 sets 10 reps Rt only 3PL      Knee/Hip Exercises: Standing   Heel Raises  Both;20 reps    Heel Raises Limitations  incline slope; Toe raises 20    Lateral Step Up  Right;15 reps;Hand Hold: 0;Step Height: 6"    Lateral Step Up Limitations  eccentric control, heel down    Forward Step Up  Right;15 reps;Step Height: 6";Hand Hold: 0    Forward Step Up Limitations  Knee drive with Lt LE 5" holds    Step Down  Right;15 reps;Hand Hold: 2;Step Height: 6"    Step Down Limitations  eccentric control, heel down    SLS with Vectors  5X10" each way    Gait Training  heel to toe gait    Other Standing Knee Exercises  Monster walk 3RT with BTR    Other Standing Knee Exercises  BAPS level 3 Rt only 10 reps each way      Manual Therapy   Manual Therapy  Joint mobilization    Manual therapy comments  Manual complete separate than rest of tx    Joint Mobilization  Patellofemoral joint mobilizations grade II-III all directions, restricted medial to lateral               PT Short Term Goals - 01/02/19 1451      PT SHORT TERM GOAL #1   Title  Patient will report understanding and regular compliance with HEP to improve ROM, decrease edema, and improve overall functional mobility.    Time  3    Period  Weeks    Status  Achieved    Target Date  01/03/19      PT SHORT TERM GOAL #2   Title  Patient will demonstrate right knee AROM 5-90 degrees to improve gait mechanics and overall functional mobility.    Time  3    Period  Weeks    Status  Achieved    Target Date  01/03/19        PT Long Term Goals - 01/02/19 1452      PT LONG TERM GOAL #1   Title  Patient will demonstrate right knee  AROM 0-125 degrees to improve gait mechanics and overall functional mobility.    Baseline  4-135    Time  6    Period  Weeks    Status  On-going      PT LONG TERM GOAL #2   Title  Patient will demonstrate improvement in MMT strength by 1 grade to improve ability to perform transfers and overall functional mobility to return to work.    Time  6    Period  Weeks    Status  Achieved      PT LONG TERM GOAL #3   Title  Patient will demonstrate ability to ambulate at a gait speed of at least 1.2 m/s on the 2MWT with minimal to no gait deviations in order to ambulate more easily in the community safely.    Baseline  1.14 m/s but with decreased stance on RT LE    Time  6    Period  Weeks    Status  Partially Met      PT LONG TERM GOAL #4   Title  Patient will demonstrate ability to perform SLS for at least 1 minute with minimal to no sway and no notable knee flexion.    Baseline  Able to satnd > 1 minute but in slight flexed position    Time  6    Period  Weeks    Status  Partially Met            Plan - 01/14/19 1702    Clinical Impression Statement  Pt c/o patella catching during gait, noted restricted lateral movements.  Manual patella mobs complete prior gait training with no reports of catching, pt instructed patella mobs to complete at home.  Continued with LE strengthening especially for quad and gluteal mm.  Added monster walking wiht theraband for hamstring and gluteal strengthening.  Pt able to complete all exercises with no reports of pain, increased difficulty wiht step down training due to muscle fatigue.  EOS with manual to medial hamstrings to reduce tightness with improved gait mechanics following.    Personal Factors and Comorbidities  Age;Comorbidity 1;Fitness;Time  since onset of injury/illness/exacerbation    Comorbidities  see above    Examination-Activity Limitations  Squat;Stairs;Locomotion Level;Stand;Transfers    Examination-Participation Restrictions  Community  Activity    Stability/Clinical Decision Making  Stable/Uncomplicated    Clinical Decision Making  Low    Rehab Potential  Good    PT Frequency  2x / week    PT Duration  3 weeks    PT Treatment/Interventions  ADLs/Self Care Home Management;Aquatic Therapy;Biofeedback;Cryotherapy;Electrical Stimulation;Iontophoresis 43m/ml Dexamethasone;Moist Heat;Ultrasound;Gait training;Stair training;Functional mobility training;Therapeutic activities;Therapeutic exercise;Balance training;Neuromuscular re-education;Patient/family education;Orthotic Fit/Training;Manual techniques;Passive range of motion;Dry needling;Taping;Joint Manipulations;DME Instruction;Scar mobilization;Energy conservation    PT Next Visit Plan  Continue cueing for proper knee and foot alignment with squatting and single leg stance exercise. Add banded monsterwalks next visit    PT Home Exercise Plan  12/13/18: Ankle pumps, heel slides, quad sets; 10/01: hamstring and gastroc stretches, SLR; 12/24/18: LAQ and TKE wiht ball or towel against wall.; 12/31/18- heel prop; 01/07/19- gastroc stretch at wall       Patient will benefit from skilled therapeutic intervention in order to improve the following deficits and impairments:  Decreased activity tolerance, Decreased range of motion, Decreased strength, Improper body mechanics, Pain, Abnormal gait, Decreased scar mobility, Decreased endurance, Hypomobility, Decreased balance, Difficulty walking, Increased edema, Impaired flexibility  Visit Diagnosis: Localized edema  Muscle weakness (generalized)  Other abnormalities of gait and mobility  Stiffness of right knee, not elsewhere classified     Problem List Patient Active Problem List   Diagnosis Date Noted  . Patellar subluxation, right, sequela 12/12/2018  . 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 Localized superficial swelling, mass, or lump   . ANKLE PAIN 07/24/2007   CIhor Austin LC[place] C[place] CAldona Lento10/27/2020, 5:07 PM  [place] [address]Phone: [phone]  Fax:  [phone] Name: MJarquavious FentressMRN: [id number] of Birth: 712-01-1996

## 2019-01-16 ENCOUNTER — Ambulatory Visit (HOSPITAL_COMMUNITY): Payer: BC Managed Care – PPO

## 2019-01-16 ENCOUNTER — Encounter (HOSPITAL_COMMUNITY): Payer: Self-pay

## 2019-01-16 ENCOUNTER — Other Ambulatory Visit: Payer: Self-pay

## 2019-01-16 DIAGNOSIS — M25661 Stiffness of right knee, not elsewhere classified: Secondary | ICD-10-CM | POA: Diagnosis not present

## 2019-01-16 DIAGNOSIS — M6281 Muscle weakness (generalized): Secondary | ICD-10-CM

## 2019-01-16 DIAGNOSIS — R600 Localized edema: Secondary | ICD-10-CM

## 2019-01-16 DIAGNOSIS — R2689 Other abnormalities of gait and mobility: Secondary | ICD-10-CM

## 2019-01-16 NOTE — Therapy (Signed)
[place], [place], [address] Phone: [phone]   Fax:  [phone]  Physical Therapy Treatment  Patient Details  Name: Victor Ashley MRN: [id number] Date of Birth: [birth year] Referring Provider (PT): Marybelle Killings, MD   Encounter Date: 01/16/2019  PT End of Session - 01/16/19 1447    Visit Number  11    Number of Visits  18    Date for PT Re-Evaluation  01/24/19   Progress note complete visit #7 on 01/02/19   Authorization Type  BCBS (No visit limit)    Authorization Time Period  12/13/18-01/24/19    PT Start Time  1445    PT Stop Time  1525    PT Time Calculation (min)  40 min    Activity Tolerance  Patient tolerated treatment well    Behavior During Therapy  [facility] for tasks assessed/performed       History reviewed. No pertinent past medical history.  Past Surgical History:  Procedure Laterality Date  . CYST EXCISION Left 07/29/2015   Procedure: EXCISION OF MASS, LEFT PALM;  Surgeon: Carole Civil, MD;  Location: [facility];  Service: Orthopedics;  Laterality: Left;    There were no vitals filed for this visit.  Subjective Assessment - 01/16/19 1446    Subjective  No real pain, just a little stiffness today,  Has began the mobs for patella at home and reports decreased catching when walking.    Patient Stated Goals  Walking normal    Currently in Pain?  No/denies                       [facility] Adult PT Treatment/Exercise - 01/16/19 0001      Exercises   Exercises  Knee/Hip      Knee/Hip Exercises: Stretches   Active Hamstring Stretch  Right;3 reps;30 seconds    Active Hamstring Stretch Limitations  standing on 12" step    Gastroc Stretch  3 reps;30 seconds    Gastroc Stretch Limitations  slant board      Knee/Hip Exercises: Machines for Strengthening   Cybex Leg Press  BLE 6 Pl 15x, RLE 2 sets 10 reps 4 plates    Other Machine  Cybex SLS RDL 40# BLE 10x each      Knee/Hip  Exercises: Standing   Heel Raises Limitations  Squat paired with heel raise    Knee Flexion Limitations  Heel walking 3RT    Lateral Step Up  Right;15 reps;Hand Hold: 0;Step Height: 6"    Lateral Step Up Limitations  with BTB TKE; eccentric control, heel down    Forward Step Up  Right;15 reps;Step Height: 6";Hand Hold: 0    Forward Step Up Limitations  Knee drive with Lt LE 5" holds    Step Down  Right;15 reps;Step Height: 6";Hand Hold: 1    Step Down Limitations  eccentric control, heel down    Gait Training  heel to toe gait    Other Standing Knee Exercises  Monster walk 3RT with BTR, squat sidestep 3RT    Other Standing Knee Exercises  BAPS level 3 Rt only 10 reps each way      Knee/Hip Exercises: Supine   Knee Extension  AROM    Knee Extension Limitations  2    Knee Flexion  AROM    Knee Flexion Limitations  134               PT Short  Term Goals - 01/02/19 1451      PT SHORT TERM GOAL #1   Title  Patient will report understanding and regular compliance with HEP to improve ROM, decrease edema, and improve overall functional mobility.    Time  3    Period  Weeks    Status  Achieved    Target Date  01/03/19      PT SHORT TERM GOAL #2   Title  Patient will demonstrate right knee AROM 5-90 degrees to improve gait mechanics and overall functional mobility.    Time  3    Period  Weeks    Status  Achieved    Target Date  01/03/19        PT Long Term Goals - 01/02/19 1452      PT LONG TERM GOAL #1   Title  Patient will demonstrate right knee AROM 0-125 degrees to improve gait mechanics and overall functional mobility.    Baseline  4-135    Time  6    Period  Weeks    Status  On-going      PT LONG TERM GOAL #2   Title  Patient will demonstrate improvement in MMT strength by 1 grade to improve ability to perform transfers and overall functional mobility to return to work.    Time  6    Period  Weeks    Status  Achieved      PT LONG TERM GOAL #3   Title   Patient will demonstrate ability to ambulate at a gait speed of at least 1.2 m/s on the 2MWT with minimal to no gait deviations in order to ambulate more easily in the community safely.    Baseline  1.14 m/s but with decreased stance on RT LE    Time  6    Period  Weeks    Status  Partially Met      PT LONG TERM GOAL #4   Title  Patient will demonstrate ability to perform SLS for at least 1 minute with minimal to no sway and no notable knee flexion.    Baseline  Able to satnd > 1 minute but in slight flexed position    Time  6    Period  Weeks    Status  Partially Met            Plan - 01/16/19 1529    Clinical Impression Statement  Added TKE with functional strengthening exercises to improve activation as well as with gait (lateral stepup).  Pt with improved gait mechanics following cueing for knee extension with heel strike.  Added foam with vector stance and began RDLs for hamstring strengthening and improve SLS, pt able to complete 10 reps without LOB.  Pt able to complete squats with equal weight bearing and good form, encouraged to wear supportive shoes to address loss of arch formation during squats to improve knee alignment, verbalized understanding.  AROM at 2-135 degrees, continues to require cueing for extension with gait.    Personal Factors and Comorbidities  Age;Comorbidity 1;Fitness;Time since onset of injury/illness/exacerbation    Comorbidities  see above    Examination-Activity Limitations  Squat;Stairs;Locomotion Level;Stand;Transfers    Examination-Participation Restrictions  Community Activity    Stability/Clinical Decision Making  Stable/Uncomplicated    Clinical Decision Making  Low    Rehab Potential  Good    PT Frequency  2x / week    PT Duration  3 weeks    PT Treatment/Interventions  ADLs/Self Care  Home Management;Aquatic Therapy;Biofeedback;Cryotherapy;Electrical Stimulation;Iontophoresis 78m/ml Dexamethasone;Moist Heat;Ultrasound;Gait training;Stair  training;Functional mobility training;Therapeutic activities;Therapeutic exercise;Balance training;Neuromuscular re-education;Patient/family education;Orthotic Fit/Training;Manual techniques;Passive range of motion;Dry needling;Taping;Joint Manipulations;DME Instruction;Scar mobilization;Energy conservation    PT Next Visit Plan  Continue cueing for proper knee and foot alignment with squatting and single leg stance exercise.    PT Home Exercise Plan  12/13/18: Ankle pumps, heel slides, quad sets; 10/01: hamstring and gastroc stretches, SLR; 12/24/18: LAQ and TKE wiht ball or towel against wall.; 12/31/18- heel prop; 01/07/19- gastroc stretch at wall       Patient will benefit from skilled therapeutic intervention in order to improve the following deficits and impairments:  Decreased activity tolerance, Decreased range of motion, Decreased strength, Improper body mechanics, Pain, Abnormal gait, Decreased scar mobility, Decreased endurance, Hypomobility, Decreased balance, Difficulty walking, Increased edema, Impaired flexibility  Visit Diagnosis: Localized edema  Muscle weakness (generalized)  Other abnormalities of gait and mobility  Stiffness of right knee, not elsewhere classified     Problem List Patient Active Problem List   Diagnosis Date Noted  . Patellar subluxation, right, sequela 12/12/2018  . 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 Localized superficial swelling, mass, or lump   . ANKLE PAIN 07/24/2007   CIhor Austin LM[place] CAldona Lento10/29/2020, 4:39 PM  [place] [address]Phone: [phone]  Fax:  [phone] Name: Victor BertholdMRN: [id number] of Birth: 7Feb 04, 1997

## 2019-01-21 ENCOUNTER — Ambulatory Visit (HOSPITAL_COMMUNITY): Payer: BC Managed Care – PPO | Admitting: Physical Therapy

## 2019-01-22 ENCOUNTER — Ambulatory Visit (HOSPITAL_COMMUNITY): Payer: BC Managed Care – PPO | Attending: Orthopaedic Surgery

## 2019-01-22 ENCOUNTER — Encounter (HOSPITAL_COMMUNITY): Payer: Self-pay

## 2019-01-22 ENCOUNTER — Other Ambulatory Visit: Payer: Self-pay

## 2019-01-22 DIAGNOSIS — M25661 Stiffness of right knee, not elsewhere classified: Secondary | ICD-10-CM

## 2019-01-22 DIAGNOSIS — M6281 Muscle weakness (generalized): Secondary | ICD-10-CM | POA: Diagnosis present

## 2019-01-22 DIAGNOSIS — R600 Localized edema: Secondary | ICD-10-CM

## 2019-01-22 DIAGNOSIS — R2689 Other abnormalities of gait and mobility: Secondary | ICD-10-CM

## 2019-01-22 NOTE — Therapy (Signed)
[place], [place], [address] Phone: [phone]   Fax:  [phone]  Physical Therapy Treatment  Patient Details  Name: Victor Ashley MRN: [id number] Date of Birth: Mar 31, 1995 Referring Provider (PT): Marybelle Killings, MD   Encounter Date: 01/22/2019  PT End of Session - 01/22/19 1744    Visit Number  12    Number of Visits  18    Date for PT Re-Evaluation  01/24/19   Progress note complete visit #7, on 01/02/19   Authorization Type  BCBS (No visit limit)    Authorization Time Period  12/13/18-01/24/19    PT Start Time  1740    PT Stop Time  1818    PT Time Calculation (min)  38 min    Activity Tolerance  Patient tolerated treatment well    Behavior During Therapy  [facility] for tasks assessed/performed       History reviewed. No pertinent past medical history.  Past Surgical History:  Procedure Laterality Date  . CYST EXCISION Left 07/29/2015   Procedure: EXCISION OF MASS, LEFT PALM;  Surgeon: Carole Civil, MD;  Location: [facility];  Service: Orthopedics;  Laterality: Left;    There were no vitals filed for this visit.  Subjective Assessment - 01/22/19 1738    Subjective  Feeling good, trying to be mindful of knee extension with gait.  No reports of pain today, swelling continues when on feet a lot.    Patient Stated Goals  Walking normal    Currently in Pain?  No/denies                       [facility] Adult PT Treatment/Exercise - 01/22/19 0001      Exercises   Exercises  Knee/Hip      Knee/Hip Exercises: Stretches   Gastroc Stretch  3 reps;30 seconds    Gastroc Stretch Limitations  slant board      Knee/Hip Exercises: Machines for Strengthening   Cybex Knee Extension  1RM: 50% Rt 30#, Lt 60#    Cybex Knee Flexion  1RM: 81% Rt: 111.5, Lt 137#    Other Machine  Cybex SLS RDL 40# BLE 15x each      Knee/Hip Exercises: Plyometrics   Bilateral Jumping  10 reps    Bilateral Jumping Limitations   in place, front and lateral      Knee/Hip Exercises: Standing   Heel Raises  20 reps    Heel Raises Limitations  Squat paired with heel raise    Knee Flexion Limitations  Heel walking 3RT    Lateral Step Up  Right;15 reps;Hand Hold: 0;Step Height: 6"    Lateral Step Up Limitations  with BTB TKE; eccentric control, heel down    Forward Step Up  Right;15 reps;Step Height: 6";Hand Hold: 0    Forward Step Up Limitations  Knee drive with Lt LE 5" holds    Step Down  Right;15 reps;Step Height: 6";Hand Hold: 1    Step Down Limitations  eccentric control, heel down    Wall Squat  3 sets    Wall Squat Limitations  30" holds    SLS with Vectors  star gazer 10x with Lt foot on washcloth    Gait Training  heel to toe gait    Other Standing Knee Exercises  Monster walk 3RT with BTR, squat sidestep 3RT               PT Short  Term Goals - 01/02/19 1451      PT SHORT TERM GOAL #1   Title  Patient will report understanding and regular compliance with HEP to improve ROM, decrease edema, and improve overall functional mobility.    Time  3    Period  Weeks    Status  Achieved    Target Date  01/03/19      PT SHORT TERM GOAL #2   Title  Patient will demonstrate right knee AROM 5-90 degrees to improve gait mechanics and overall functional mobility.    Time  3    Period  Weeks    Status  Achieved    Target Date  01/03/19        PT Long Term Goals - 01/02/19 1452      PT LONG TERM GOAL #1   Title  Patient will demonstrate right knee AROM 0-125 degrees to improve gait mechanics and overall functional mobility.    Baseline  4-135    Time  6    Period  Weeks    Status  On-going      PT LONG TERM GOAL #2   Title  Patient will demonstrate improvement in MMT strength by 1 grade to improve ability to perform transfers and overall functional mobility to return to work.    Time  6    Period  Weeks    Status  Achieved      PT LONG TERM GOAL #3   Title  Patient will demonstrate ability  to ambulate at a gait speed of at least 1.2 m/s on the 2MWT with minimal to no gait deviations in order to ambulate more easily in the community safely.    Baseline  1.14 m/s but with decreased stance on RT LE    Time  6    Period  Weeks    Status  Partially Met      PT LONG TERM GOAL #4   Title  Patient will demonstrate ability to perform SLS for at least 1 minute with minimal to no sway and no notable knee flexion.    Baseline  Able to satnd > 1 minute but in slight flexed position    Time  6    Period  Weeks    Status  Partially Met            Plan - 01/22/19 1820    Clinical Impression Statement  Continued session focus with strengthening and improving knee extension with gait.  1RM complete with hamstrings at 81% and quad at 50%.  Increased focus with quad strengthening with 30" holds, step up training.  Also progressed to BLE light plyometrics as RTS with dynamic movement and SLS with kicking.  Pt able to demonstrate good mechanics wiht landing wiht BLE knees slightly bent and equal stance phase.  Also added star gazer to improve SLS with dynamic movements.  No reports of pain, was limited by fatiuge with activiites.    Personal Factors and Comorbidities  Age;Comorbidity 1;Fitness;Time since onset of injury/illness/exacerbation    Comorbidities  see above    Examination-Activity Limitations  Squat;Stairs;Locomotion Level;Stand;Transfers    Examination-Participation Restrictions  Community Activity    Stability/Clinical Decision Making  Stable/Uncomplicated    Clinical Decision Making  Low    Rehab Potential  Good    PT Frequency  2x / week    PT Duration  3 weeks    PT Treatment/Interventions  ADLs/Self Care Home Management;Aquatic Therapy;Biofeedback;Cryotherapy;Electrical Stimulation;Iontophoresis 44m/ml Dexamethasone;Moist Heat;Ultrasound;Gait training;Stair  training;Functional mobility training;Therapeutic activities;Therapeutic exercise;Balance training;Neuromuscular  re-education;Patient/family education;Orthotic Fit/Training;Manual techniques;Passive range of motion;Dry needling;Taping;Joint Manipulations;DME Instruction;Scar mobilization;Energy conservation    PT Next Visit Plan  Reassess next session.    PT Home Exercise Plan  12/13/18: Ankle pumps, heel slides, quad sets; 10/01: hamstring and gastroc stretches, SLR; 12/24/18: LAQ and TKE wiht ball or towel against wall.; 12/31/18- heel prop; 01/07/19- gastroc stretch at wall       Patient will benefit from skilled therapeutic intervention in order to improve the following deficits and impairments:  Decreased activity tolerance, Decreased range of motion, Decreased strength, Improper body mechanics, Pain, Abnormal gait, Decreased scar mobility, Decreased endurance, Hypomobility, Decreased balance, Difficulty walking, Increased edema, Impaired flexibility  Visit Diagnosis: Localized edema  Muscle weakness (generalized)  Other abnormalities of gait and mobility  Stiffness of right knee, not elsewhere classified     Problem List Patient Active Problem List   Diagnosis Date Noted  . Patellar subluxation, right, sequela 12/12/2018  . 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 Localized superficial swelling, mass, or lump   . ANKLE PAIN 07/24/2007   Ihor Austin, [place]; Corinne  Aldona Lento 01/22/2019, 6:29 PM  [place], [place], [address] Phone: [phone]   Fax:  [phone]  Name: Ysabel Cowgill MRN: [id number] Date of Birth: 01-31-1996

## 2019-01-23 ENCOUNTER — Encounter (HOSPITAL_COMMUNITY): Payer: BC Managed Care – PPO | Admitting: Physical Therapy

## 2019-01-24 ENCOUNTER — Other Ambulatory Visit: Payer: Self-pay

## 2019-01-24 ENCOUNTER — Ambulatory Visit (HOSPITAL_COMMUNITY): Payer: BC Managed Care – PPO | Admitting: Physical Therapy

## 2019-01-24 ENCOUNTER — Encounter (HOSPITAL_COMMUNITY): Payer: Self-pay | Admitting: Physical Therapy

## 2019-01-24 DIAGNOSIS — R600 Localized edema: Secondary | ICD-10-CM | POA: Diagnosis not present

## 2019-01-24 DIAGNOSIS — M25661 Stiffness of right knee, not elsewhere classified: Secondary | ICD-10-CM

## 2019-01-24 DIAGNOSIS — R2689 Other abnormalities of gait and mobility: Secondary | ICD-10-CM

## 2019-01-24 DIAGNOSIS — M6281 Muscle weakness (generalized): Secondary | ICD-10-CM

## 2019-01-24 NOTE — Therapy (Signed)
[place], [place], [address] Phone: [phone]   Fax:  [phone]  Physical Therapy Treatment / Progress Note  Patient Details  Name: Victor Ashley MRN: [id number] Date of Birth: 06/20/1995 Referring Provider (PT): Marybelle Killings, MD   Encounter Date: 01/24/2019   Progress Note Reporting Period 01/02/19 to 01/24/19  See note below for Objective Data and Assessment of Progress/Goals.       PT End of Session - 01/24/19 1341    Visit Number  13    Number of Visits  18    Date for PT Re-Evaluation  02/28/19    Authorization Type  BCBS (No visit limit)    Authorization Time Period  12/13/18-01/24/19    PT Start Time  1300    PT Stop Time  1328    PT Time Calculation (min)  28 min    Activity Tolerance  Patient tolerated treatment well    Behavior During Therapy  [facility] for tasks assessed/performed       History reviewed. No pertinent past medical history.  Past Surgical History:  Procedure Laterality Date  . CYST EXCISION Left 07/29/2015   Procedure: EXCISION OF MASS, LEFT PALM;  Surgeon: Carole Civil, MD;  Location: [facility];  Service: Orthopedics;  Laterality: Left;    There were no vitals filed for this visit.  Subjective Assessment - 01/24/19 1304    Subjective  Patient reported work is going well. He stated he still has some swelling in it, but not really pain. he reported sometimes when he walks it has a "catching" sensation. Patient reported feeling good to continue exercises at home.    Patient Stated Goals  Walking normal    Currently in Pain?  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    Referring Provider (PT)  Marybelle Killings, MD    Next MD Visit  11/19      Precautions   Precautions  None      Observation/Other Assessments   Focus on Therapeutic Outcomes (FOTO)   27% limited    was 36% limited     Circumferential Edema   Circumferential - Right  42 cm at joint line    Circumferential - Left   41 cm at joint line      AROM   Right Knee Extension  2    Right Knee Flexion  137      Strength   Right Hip Flexion  5/5    Right Hip Extension  5/5   was 4+   Right Hip ABduction  5/5   was 4+   Left Hip Flexion  5/5    Left Hip Extension  5/5    Left Hip ABduction  5/5    Right Knee Flexion  5/5   was 4+   Right Knee Extension  5/5   was 4+   Left Knee Flexion  5/5    Left Knee Extension  5/5    Right Ankle Dorsiflexion  5/5    Left Ankle Dorsiflexion  5/5      Ambulation/Gait   Ambulation/Gait  Yes    Ambulation/Gait Assistance  7: Independent    Ambulation Distance (Feet)  456 Feet   2MWT   Assistive device  None    Gait Pattern  Within Functional Limits  Ambulation Surface  Level;Indoor      Balance   Balance Assessed  Yes      Static Standing Balance   Static Standing - Balance Support  No upper extremity supported    Static Standing Balance -  Activities   Single Leg Stance - Right Leg    Static Standing - Comment/# of Minutes  >1 minute on RT                           PT Education - 01/24/19 1341    Education Details  Discussed examination findings, updated HEP, and follow-up visit in 1 month as needed.    Person(s) Educated  Patient    Methods  Explanation;Handout    Comprehension  Verbalized understanding       PT Short Term Goals - 01/24/19 1335      PT SHORT TERM GOAL #1   Title  Patient will report understanding and regular compliance with HEP to improve ROM, decrease edema, and improve overall functional mobility.    Time  3    Period  Weeks    Status  Achieved    Target Date  01/03/19      PT SHORT TERM GOAL #2   Title  Patient will demonstrate right knee AROM 5-90 degrees to improve gait mechanics and overall functional mobility.    Time  3    Period  Weeks    Status  Achieved    Target Date   01/03/19        PT Long Term Goals - 01/24/19 1335      PT LONG TERM GOAL #1   Title  Patient will demonstrate right knee AROM 0-125 degrees to improve gait mechanics and overall functional mobility.    Baseline  2-137    Time  6    Period  Weeks    Status  Partially Met      PT LONG TERM GOAL #2   Title  Patient will demonstrate improvement in MMT strength by 1 grade to improve ability to perform transfers and overall functional mobility to return to work.    Time  6    Period  Weeks    Status  Achieved      PT LONG TERM GOAL #3   Title  Patient will demonstrate ability to ambulate at a gait speed of at least 1.2 m/s on the 2MWT with minimal to no gait deviations in order to ambulate more easily in the community safely.    Baseline  01/24/19: 1.15 m/s (about 1.2 m/s) WFL    Time  6    Period  Weeks    Status  Achieved      PT LONG TERM GOAL #4   Title  Patient will demonstrate ability to perform SLS for at least 1 minute with minimal to no sway and no notable knee flexion.    Baseline  01/24/19: Able to satnd > 1 minute nearly full extension    Time  6    Period  Weeks    Status  Achieved      PT LONG TERM GOAL #5   Title  Patient will report understanding and regular compliance with advanced HEP.    Time  1    Period  Months    Status  New    Target Date  02/23/19            Plan - 01/24/19 1346  Clinical Impression Statement  This session performed re-assessment of patient's progress towards goals. Patient achieved all remaining long term goals. Patient was educated on examination findings and progressing strengthening slowly. In addition, educated patient on waiting until his follow-up visit with his MD before returning to martial arts. Patient reported feeling confident to continue exercises independently. Discussed plan to have patient perform HEP independently for about 5 weeks and then follow-up with therapy at that time as needed. Patient consented to this  plan.    Personal Factors and Comorbidities  Age;Comorbidity 1;Fitness;Time since onset of injury/illness/exacerbation    Comorbidities  see above    Examination-Activity Limitations  Squat;Stairs;Locomotion Level;Stand;Transfers    Examination-Participation Restrictions  Community Activity    Stability/Clinical Decision Making  Stable/Uncomplicated    Rehab Potential  Good    PT Frequency  Other (comment)   Monthly   PT Duration  Other (comment)   1 follow-up visit in about 5 weeks as needed   PT Treatment/Interventions  ADLs/Self Care Home Management;Aquatic Therapy;Biofeedback;Cryotherapy;Electrical Stimulation;Iontophoresis 53m/ml Dexamethasone;Moist Heat;Ultrasound;Gait training;Stair training;Functional mobility training;Therapeutic activities;Therapeutic exercise;Balance training;Neuromuscular re-education;Patient/family education;Orthotic Fit/Training;Manual techniques;Passive range of motion;Dry needling;Taping;Joint Manipulations;DME Instruction;Scar mobilization;Energy conservation    PT Next Visit Plan  Re-evaluation as needed    PT Home Exercise Plan  12/13/18: Ankle pumps, heel slides, quad sets; 10/01: hamstring and gastroc stretches, SLR; 12/24/18: LAQ and TKE wiht ball or towel against wall.; 12/31/18- heel prop; 01/07/19- gastroc stretch at wall; 01/24/19: Wall squat 3x30'' 4x/week, RDL with press 4x/week    Consulted and Agree with Plan of Care  Patient       Patient will benefit from skilled therapeutic intervention in order to improve the following deficits and impairments:  Decreased activity tolerance, Decreased range of motion, Decreased strength, Improper body mechanics, Pain, Abnormal gait, Decreased scar mobility, Decreased endurance, Hypomobility, Decreased balance, Difficulty walking, Increased edema, Impaired flexibility  Visit Diagnosis: Stiffness of right knee, not elsewhere classified  Localized edema  Muscle weakness (generalized)  Other abnormalities of  gait and mobility     Problem List Patient Active Problem List   Diagnosis Date Noted  . Patellar subluxation, right, sequela 12/12/2018  . 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 Localized superficial swelling, mass, or lump   . ANKLE PAIN 07/24/2007   MClarene CritchleyPT, DPT 1:52 PM, 01/24/19 [address] [address]Phone: [phone]  Fax:  [phone] Name: MIwao ShamblinMRN: [id number] of Birth: [birth year]

## 2019-01-24 NOTE — Patient Instructions (Signed)
Access Code: [email or web address]  URL: [email or web address]  Date: 01/24/2019  Prepared by: Clarene Critchley   Exercises Wall Quarter Squat - 3 sets - 30 second hold - 1x daily - 4x weekly                  [place] Deadlift with Curl to Press - 10 reps - 3 sets - 1x daily - 4x weekly

## 2019-01-27 ENCOUNTER — Ambulatory Visit (HOSPITAL_COMMUNITY): Payer: BC Managed Care – PPO | Admitting: Physical Therapy

## 2019-02-06 ENCOUNTER — Encounter: Payer: Self-pay | Admitting: Orthopaedic Surgery

## 2019-02-06 ENCOUNTER — Ambulatory Visit (INDEPENDENT_AMBULATORY_CARE_PROVIDER_SITE_OTHER): Payer: BC Managed Care – PPO | Admitting: Orthopaedic Surgery

## 2019-02-06 VITALS — BP 127/76 | HR 84 | Ht 70.0 in | Wt 210.0 lb

## 2019-02-06 DIAGNOSIS — S83001S Unspecified subluxation of right patella, sequela: Secondary | ICD-10-CM

## 2019-02-07 NOTE — Progress Notes (Signed)
   Post-Op Visit Note   Patient: Victor Ashley           Date of Birth: 1995-08-21           MRN: [id number] Visit Date: 02/06/2019 PCP: Caryl Bis, MD   Assessment & Plan: Follow-up right knee patellar chondroplasty not down to raw bone and lateral retinacular arthroscopic release.  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  Swelling is down and we discussed slow resumption of normal activity and avoiding any combat martial arts for several months and he will continue with his knee rehab.  Patient is here with his father.  Father states he has been very compliant with following instructions.  No problems with the opposite left knee.  He will follow-up here as needed.  Chief Complaint:  Chief Complaint  Patient presents with  . 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 Patellar subluxation, right, sequela     Plan: Outlined plan as above.  Follow-Up Instructions: No follow-ups on file.   Orders:  No orders of the defined types were placed in this encounter.  No orders of the defined types were placed in this encounter.   Imaging: No results found.  PMFS History: Patient Active Problem List   Diagnosis Date Noted  . Patellar subluxation, right, sequela 12/12/2018  . 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 Localized superficial swelling, mass, or lump   . ANKLE PAIN 07/24/2007   No past medical history on file.  No family history on file.  Past Surgical History:  Procedure Laterality Date  . CYST EXCISION Left 07/29/2015   Procedure: EXCISION OF MASS, LEFT PALM;  Surgeon: Carole Civil, MD;  Location: [facility];  Service: Orthopedics;  Laterality: Left;   Social History   Occupational History  . Not on file  Tobacco Use  . Smoking status:  Never Smoker  . Smokeless tobacco: Never Used  Substance and Sexual Activity  . Alcohol use: No  . Drug use: Yes    Types: Marijuana  . Sexual activity: Not on file

## 2019-02-28 ENCOUNTER — Telehealth (HOSPITAL_COMMUNITY): Payer: Self-pay | Admitting: Physical Therapy

## 2019-02-28 ENCOUNTER — Ambulatory Visit (HOSPITAL_COMMUNITY): Payer: BC Managed Care – PPO | Admitting: Physical Therapy

## 2019-02-28 NOTE — Telephone Encounter (Signed)
Pt cx on phone tree - call to confirm s/w his dad and his dad will have Valeria call us back to let us know if he is coming. NF  Follow-up appointment

## 2019-05-02 ENCOUNTER — Encounter (HOSPITAL_COMMUNITY): Payer: Self-pay | Admitting: Physical Therapy

## 2019-05-02 NOTE — Therapy (Signed)
[place], [place], [address] Phone: [phone]   Fax:  [phone]  Patient Details  Name: Victor Ashley MRN: [id number] Date of Birth: 03-04-1996 Referring Provider:  No ref. provider found  Encounter Date: 05/02/2019   PHYSICAL THERAPY DISCHARGE SUMMARY  Visits from Start of Care: 13  Current functional level related to goals / functional outcomes:  From last assessment on 01/24/19:   Observation/Other Assessments   Focus on Therapeutic Outcomes (FOTO)   27% limited   was 36% limited       Circumferential Edema   Circumferential - Right  42 cm at joint line    Circumferential - Left   41 cm at joint line        AROM   Right Knee Extension  2    Right Knee Flexion  137        Strength   Right Hip Flexion  5/5    Right Hip Extension  5/5   was 4+   Right Hip ABduction  5/5   was 4+   Left Hip Flexion  5/5    Left Hip Extension  5/5    Left Hip ABduction  5/5    Right Knee Flexion  5/5   was 4+   Right Knee Extension  5/5   was 4+   Left Knee Flexion  5/5    Left Knee Extension  5/5    Right Ankle Dorsiflexion  5/5    Left Ankle Dorsiflexion  5/5        Ambulation/Gait   Ambulation/Gait  Yes    Ambulation/Gait Assistance  7: Independent    Ambulation Distance (Feet)  456 Feet   2MWT   Assistive device  None    Gait Pattern  Within Functional Limits    Ambulation Surface  Level;Indoor        Balance   Balance Assessed  Yes        Static Standing Balance   Static Standing - Balance Support  No upper extremity supported    Static Standing Balance -  Activities   Single Leg Stance - Right Leg    Static Standing - Comment/# of Minutes  >1 minute on RT       Remaining deficits: See above. Not fully known as patient did not return for 1 month follow-up visit.    Education / Equipment: HEP Plan: Patient agrees to discharge.  Patient goals were  partially met. Patient is being discharged due to not returning since the last visit.  ?????     Clarene Critchley PT, DPT 3:43 PM, 05/02/19 [place], [place], [address] Phone: [phone]   Fax:  [phone]

## 2020-05-01 IMAGING — MR MRI OF THE RIGHT KNEE WITHOUT CONTRAST
4 of 7 series · 14 of 40 positions shown · non-contrast
Comparison: None.

CLINICAL DATA: Persistent knee pain for 6 weeks

EXAM:
MRI OF THE RIGHT KNEE WITHOUT CONTRAST
TECHNIQUE: Multiplanar, multisequence MR imaging of the knee was performed. No
intravenous contrast was administered.

[Series 3: T2 fat-sat · axial · 4.0mm · 0.24mm/px · z∈[-24,+71]mm · 3 of 24 slices shown]
[im 5/24]
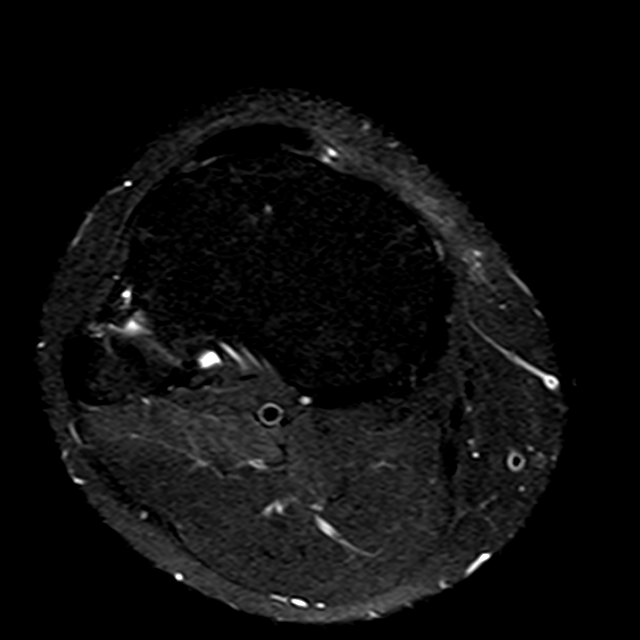
[im 14/24]
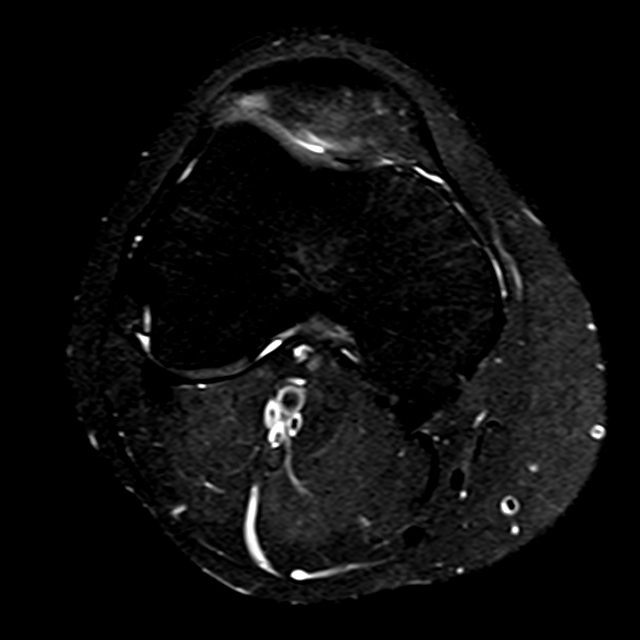
[im 24/24]
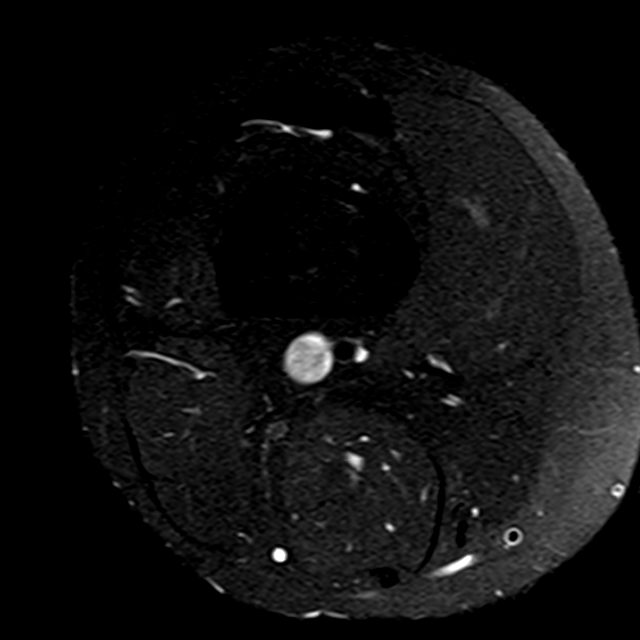

[Series 6: PD fat-sat · coronal · 3.0mm · 0.23mm/px · 5 of 36 slices shown (1 of 3)]
[im 1/36]
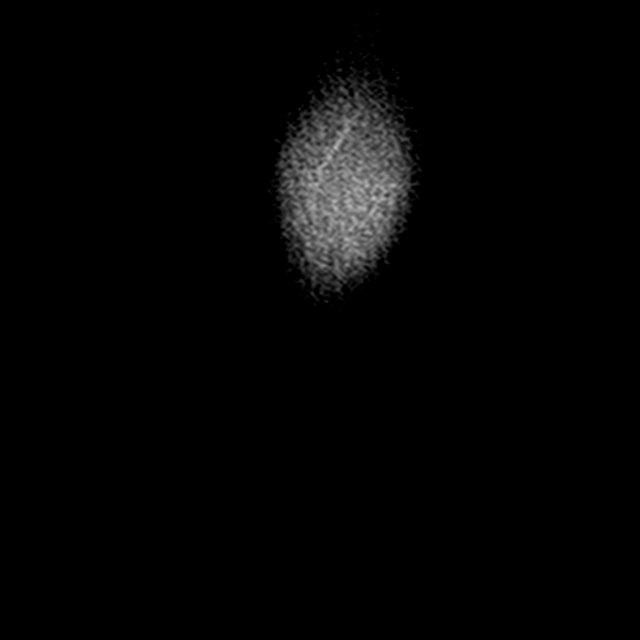
[im 6/36]
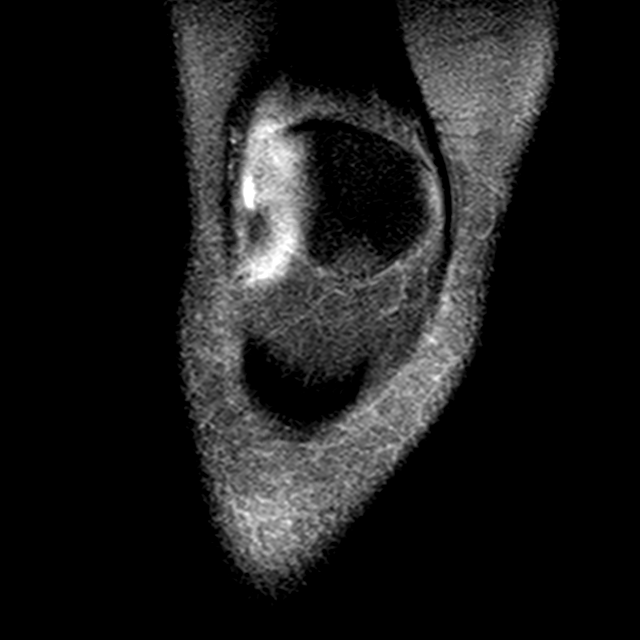
[im 12/36]
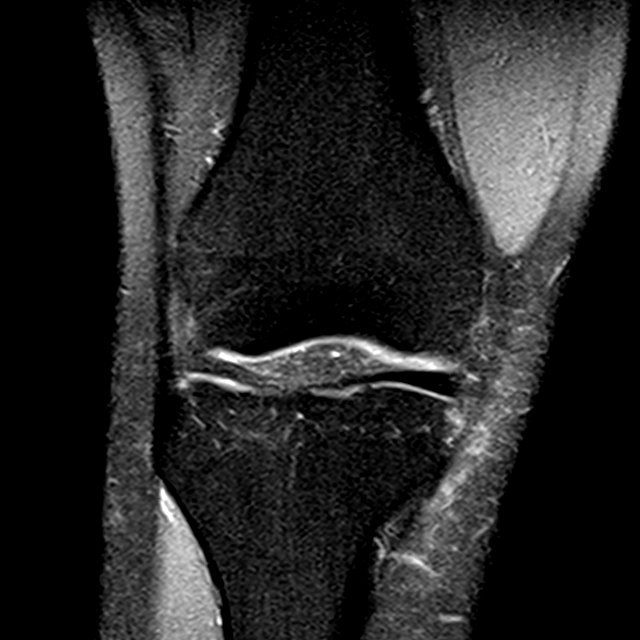
[im 18/36]
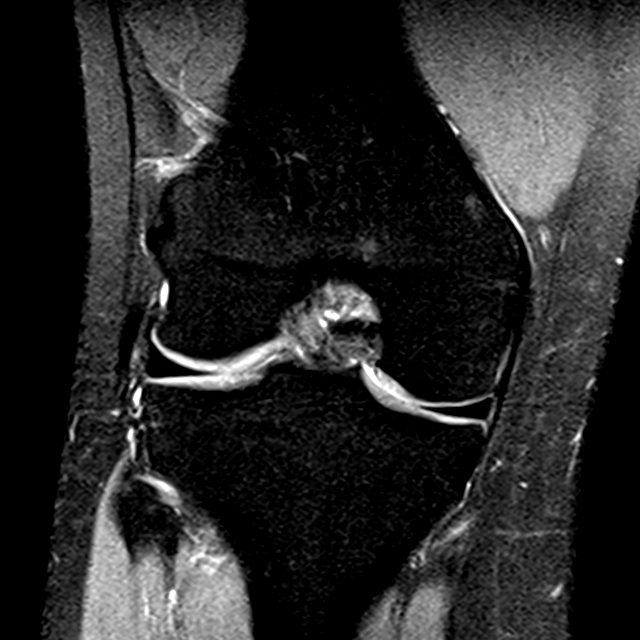
[im 30/36]
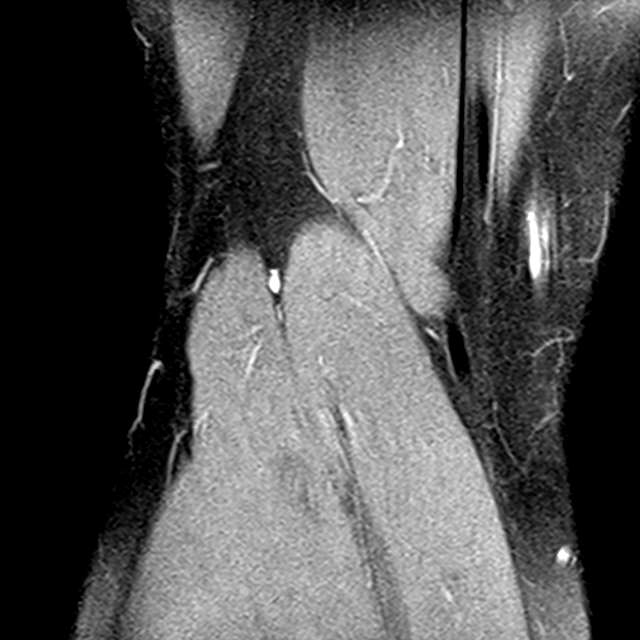

[Series 7: PD fat-sat · sagittal · 3.0mm · 0.23mm/px · 3 of 29 slices shown (2 of 3)]
[im 6/29]
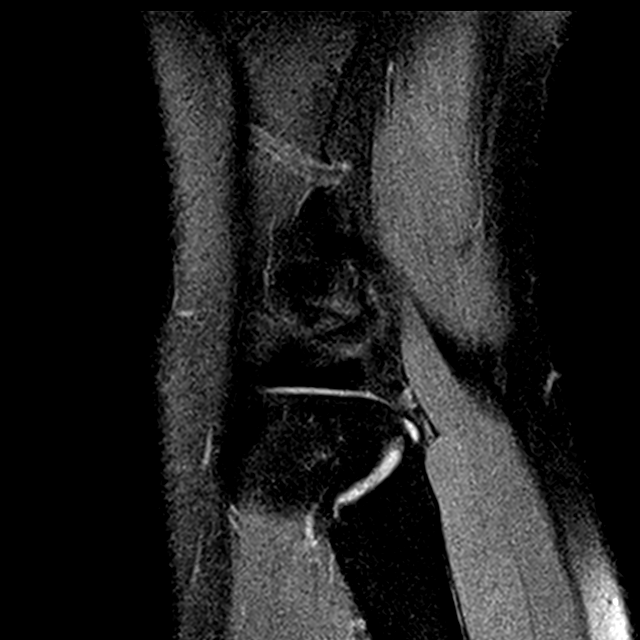
[im 17/29]
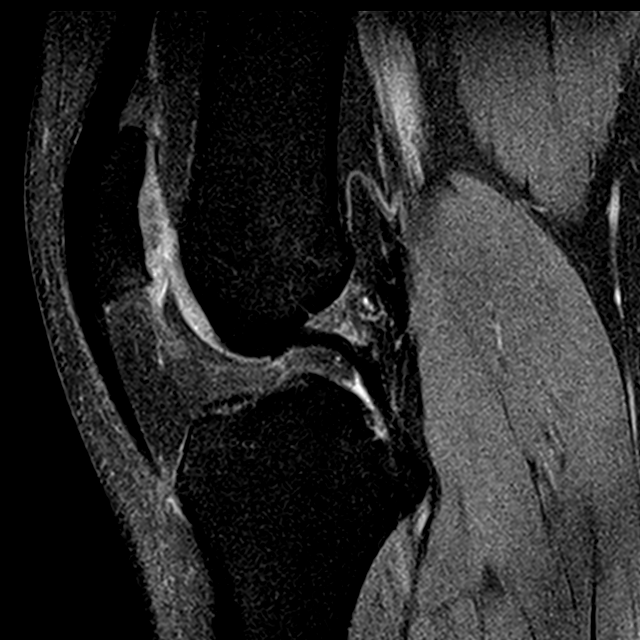
[im 29/29]
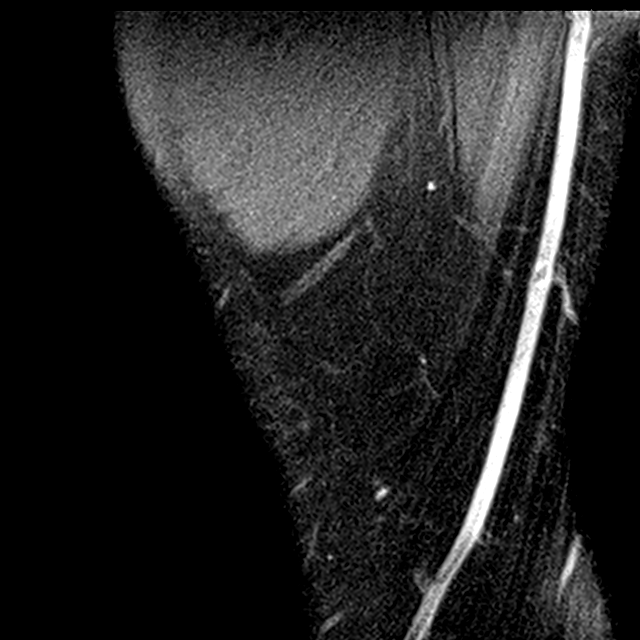

[Series 9: PD fat-sat · coronal · 2.0mm · 0.22mm/px · 3 of 15 slices shown (3 of 3)]
[im 1/15]
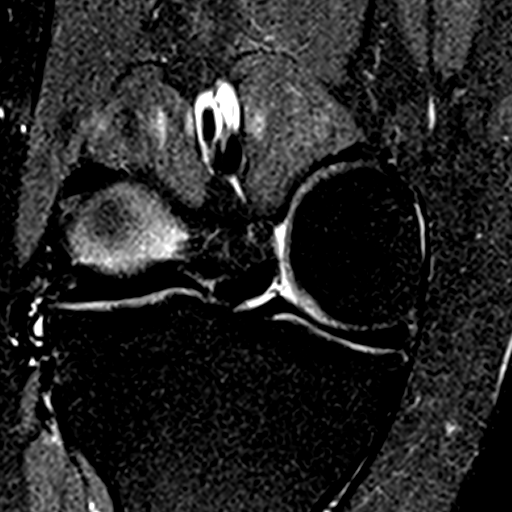
[im 8/15]
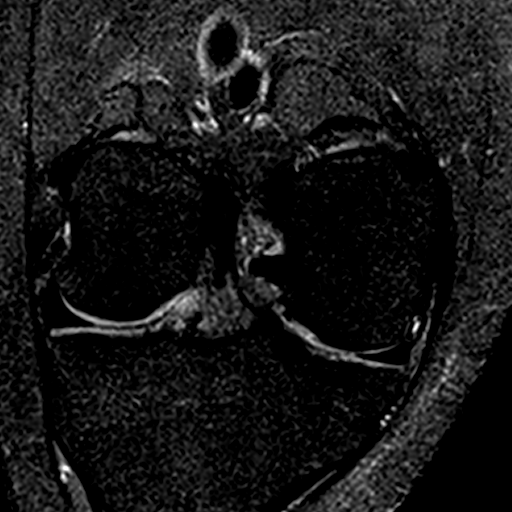
[im 15/15]
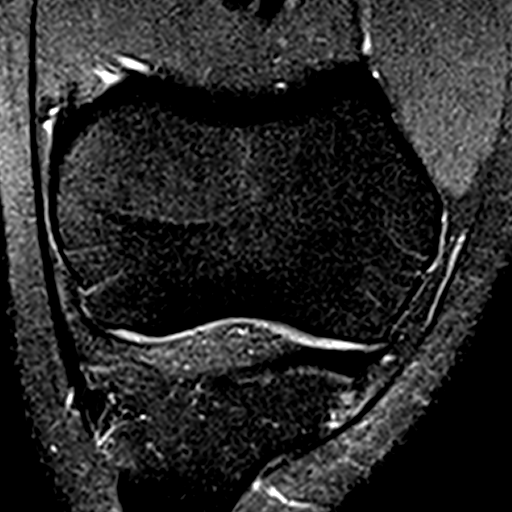

[14 of 40 positions shown; findings below may reference images not displayed]

FINDINGS: MENISCI

Medial: There is increased intrameniscal signal seen at the
posterior horn of the medial meniscus, however does not contact the
underlying articular surface.

Lateral: Intact.

LIGAMENTS

Cruciates: The ACL is intact. The PCL is intact.

Collaterals: The MCL is intact. The lateral collateral ligamentous
complex is intact.

CARTILAGE

Patellofemoral: There is minimal chondral fissuring seen in the
central patellar apex.

Medial compartment: Normal.

Lateral compartment: Normal.

BONES: There is minimally increased signal seen along the lateral
proximal tibia which is nonspecific. No fracture. No avascular
necrosis. No pathologic marrow infiltration.

JOINT: No joint effusion. Normal Felner Ceola. No plical
thickening.

EXTENSOR MECHANISM: The patellar and quadriceps tendon are intact.
There is slight lateral subluxation of the patella with mildly
increased signal seen at the superolateral corner of Hoffa's fat
pad.

POPLITEAL FOSSA: No popliteal cyst.

OTHER:  The visualized muscles are normal in appearance.
IMPRESSION: 1. Intrasubstance sprain of the posterior medial meniscus. No
definite meniscal tear.
2. Intact cruciate ligaments
3. Mild chondral disease within the patellofemoral compartment
4. Findings which could be suggestive of minimal patellofemoral
maltracking.
5. Nonspecific mild reactive marrow within the proximal lateral
tibia. No osseous fracture.
